# Patient Record
Sex: Female | Born: 1955
Health system: Southern US, Community
[De-identification: ages and names within clinical notes are randomized; demographics above are authoritative.]

## PROBLEM LIST (undated history)

## (undated) DIAGNOSIS — I10 Essential (primary) hypertension: Secondary | ICD-10-CM

## (undated) DIAGNOSIS — E079 Disorder of thyroid, unspecified: Secondary | ICD-10-CM

## (undated) DIAGNOSIS — E119 Type 2 diabetes mellitus without complications: Secondary | ICD-10-CM

## (undated) DIAGNOSIS — N289 Disorder of kidney and ureter, unspecified: Secondary | ICD-10-CM

## (undated) DIAGNOSIS — J449 Chronic obstructive pulmonary disease, unspecified: Secondary | ICD-10-CM

## (undated) HISTORY — PX: TUBAL LIGATION: SHX77

## (undated) HISTORY — PX: BACK SURGERY: SHX140

---

## 2000-09-12 ENCOUNTER — Encounter: Admission: RE | Admit: 2000-09-12 | Discharge: 2000-12-11 | Payer: Self-pay | Admitting: Internal Medicine

## 2000-10-18 ENCOUNTER — Encounter: Payer: Self-pay | Admitting: Internal Medicine

## 2000-10-18 ENCOUNTER — Encounter: Admission: RE | Admit: 2000-10-18 | Discharge: 2000-10-18 | Payer: Self-pay | Admitting: Internal Medicine

## 2010-02-21 ENCOUNTER — Emergency Department (HOSPITAL_BASED_OUTPATIENT_CLINIC_OR_DEPARTMENT_OTHER): Admission: EM | Admit: 2010-02-21 | Discharge: 2010-02-21 | Payer: Self-pay | Admitting: Emergency Medicine

## 2010-02-21 ENCOUNTER — Ambulatory Visit: Payer: Self-pay | Admitting: Diagnostic Radiology

## 2010-05-24 ENCOUNTER — Emergency Department (HOSPITAL_BASED_OUTPATIENT_CLINIC_OR_DEPARTMENT_OTHER): Admission: EM | Admit: 2010-05-24 | Discharge: 2010-05-24 | Payer: Self-pay | Admitting: Emergency Medicine

## 2010-06-28 ENCOUNTER — Emergency Department (HOSPITAL_BASED_OUTPATIENT_CLINIC_OR_DEPARTMENT_OTHER): Admission: EM | Admit: 2010-06-28 | Discharge: 2010-06-28 | Payer: Self-pay | Admitting: Emergency Medicine

## 2010-08-03 ENCOUNTER — Ambulatory Visit (HOSPITAL_COMMUNITY): Admission: RE | Admit: 2010-08-03 | Discharge: 2010-08-04 | Payer: Self-pay | Admitting: Specialist

## 2010-08-11 ENCOUNTER — Inpatient Hospital Stay (HOSPITAL_COMMUNITY): Admission: AD | Admit: 2010-08-11 | Discharge: 2010-08-15 | Payer: Self-pay | Admitting: Specialist

## 2010-09-21 ENCOUNTER — Inpatient Hospital Stay (HOSPITAL_COMMUNITY): Admission: RE | Admit: 2010-09-21 | Discharge: 2010-09-25 | Payer: Self-pay | Admitting: Specialist

## 2010-09-29 ENCOUNTER — Ambulatory Visit: Payer: Self-pay | Admitting: Psychiatry

## 2010-10-05 ENCOUNTER — Other Ambulatory Visit: Payer: Self-pay | Admitting: Emergency Medicine

## 2010-10-05 ENCOUNTER — Inpatient Hospital Stay (HOSPITAL_COMMUNITY)
Admission: RE | Admit: 2010-10-05 | Discharge: 2010-10-07 | Payer: Self-pay | Source: Home / Self Care | Admitting: Psychiatry

## 2010-10-27 ENCOUNTER — Encounter: Admit: 2010-10-27 | Payer: Self-pay | Admitting: Specialist

## 2011-01-26 LAB — BASIC METABOLIC PANEL
BUN: 5 mg/dL — ABNORMAL LOW (ref 6–23)
BUN: 7 mg/dL (ref 6–23)
CO2: 27 mEq/L (ref 19–32)
Chloride: 103 mEq/L (ref 96–112)
Creatinine, Ser: 0.69 mg/dL (ref 0.4–1.2)
GFR calc Af Amer: >60 mL/min (ref >60)
Glucose, Bld: 113 mg/dL — ABNORMAL HIGH (ref 70–99)
Potassium: 3.9 mEq/L (ref 3.5–5.1)
Potassium: 4.2 mEq/L (ref 3.5–5.1)
Sodium: 135 mEq/L (ref 135–145)
Sodium: 139 mEq/L (ref 135–145)

## 2011-01-26 LAB — COMPREHENSIVE METABOLIC PANEL
ALT: 15 U/L (ref 0–35)
Alkaline Phosphatase: 60 U/L (ref 39–117)
BUN: 4 mg/dL — ABNORMAL LOW (ref 6–23)
CO2: 29 mEq/L (ref 19–32)
Chloride: 100 mEq/L (ref 96–112)
Creatinine, Ser: 0.72 mg/dL (ref 0.4–1.2)
Sodium: 136 mEq/L (ref 135–145)
Total Bilirubin: 0.5 mg/dL (ref 0.3–1.2)
Total Protein: 6.7 g/dL (ref 6.0–8.3)

## 2011-01-26 LAB — URINALYSIS, ROUTINE W REFLEX MICROSCOPIC
Bilirubin Urine: NEGATIVE
Hgb urine dipstick: NEGATIVE
Urobilinogen, UA: 0.2 mg/dL (ref 0.0–1.0)
pH: 6 (ref 5.0–8.0)

## 2011-01-26 LAB — DIFFERENTIAL
Basophils Relative: 0 % (ref 0–1)
Eosinophils Absolute: 0.1 10*3/uL (ref 0.0–0.7)
Eosinophils Relative: 1 % (ref 0–5)
Lymphocytes Relative: 22 % (ref 12–46)
Lymphs Abs: 2 10*3/uL (ref 0.7–4.0)
Monocytes Absolute: 0.5 10*3/uL (ref 0.1–1.0)
Monocytes Relative: 4 % (ref 3–12)
Neutro Abs: 6.4 10*3/uL (ref 1.7–7.7)
Neutro Abs: 8.5 10*3/uL — ABNORMAL HIGH (ref 1.7–7.7)

## 2011-01-26 LAB — CBC
HCT: 31.9 % — ABNORMAL LOW (ref 36.0–46.0)
Hemoglobin: 10.2 g/dL — ABNORMAL LOW (ref 12.0–15.0)
MCH: 31.1 pg (ref 26.0–34.0)
MCH: 31.4 pg (ref 26.0–34.0)
MCHC: 33 g/dL (ref 30.0–36.0)
MCHC: 33.3 g/dL (ref 30.0–36.0)
MCHC: 33.7 g/dL (ref 30.0–36.0)
MCV: 93.1 fL (ref 78.0–100.0)
Platelets: 418 10*3/uL — ABNORMAL HIGH (ref 150–400)
RBC: 3.43 MIL/uL — ABNORMAL LOW (ref 3.87–5.11)
RDW: 13.2 % (ref 11.5–15.5)
RDW: 15.1 % (ref 11.5–15.5)
WBC: 8.9 10*3/uL (ref 4.0–10.5)
WBC: 9.1 10*3/uL (ref 4.0–10.5)

## 2011-01-26 LAB — RAPID URINE DRUG SCREEN, HOSP PERFORMED
Amphetamines: POSITIVE — AB
Benzodiazepines: NOT DETECTED

## 2011-01-26 LAB — SURGICAL PCR SCREEN: MRSA, PCR: NEGATIVE

## 2011-01-26 LAB — TRICYCLICS SCREEN, URINE: TCA Scrn: NOT DETECTED

## 2011-01-26 LAB — TYPE AND SCREEN: ABO/RH(D): A NEG

## 2011-01-28 LAB — CBC
Hemoglobin: 15.5 g/dL — ABNORMAL HIGH (ref 12.0–15.0)
Platelets: 223 10*3/uL (ref 150–400)
Platelets: 306 10*3/uL (ref 150–400)
RBC: 4.21 MIL/uL (ref 3.87–5.11)
RBC: 4.68 MIL/uL (ref 3.87–5.11)
RDW: 13.1 % (ref 11.5–15.5)

## 2011-01-28 LAB — URINALYSIS, MICROSCOPIC ONLY
Bilirubin Urine: NEGATIVE
Hgb urine dipstick: NEGATIVE
Ketones, ur: NEGATIVE mg/dL
Nitrite: NEGATIVE
pH: 7 (ref 5.0–8.0)

## 2011-01-28 LAB — COMPREHENSIVE METABOLIC PANEL
AST: 13 U/L (ref 0–37)
Albumin: 4.9 g/dL (ref 3.5–5.2)
Alkaline Phosphatase: 59 U/L (ref 39–117)
BUN: 7 mg/dL (ref 6–23)
BUN: 8 mg/dL (ref 6–23)
CO2: 29 mEq/L (ref 19–32)
CO2: 30 mEq/L (ref 19–32)
Chloride: 95 mEq/L — ABNORMAL LOW (ref 96–112)
Creatinine, Ser: 0.66 mg/dL (ref 0.4–1.2)
Creatinine, Ser: 0.7 mg/dL (ref 0.4–1.2)
GFR calc Af Amer: >60 mL/min (ref >60)
GFR calc non Af Amer: >60 mL/min (ref >60)
GFR calc non Af Amer: >60 mL/min (ref >60)
Glucose, Bld: 98 mg/dL (ref 70–99)
Potassium: 3.9 mEq/L (ref 3.5–5.1)
Total Bilirubin: 0.4 mg/dL (ref 0.3–1.2)

## 2011-01-28 LAB — DIFFERENTIAL
Basophils Relative: 0 % (ref 0–1)
Eosinophils Absolute: 0.1 10*3/uL (ref 0.0–0.7)
Eosinophils Relative: 1 % (ref 0–5)
Lymphocytes Relative: 23 % (ref 12–46)
Lymphs Abs: 2.3 10*3/uL (ref 0.7–4.0)
Monocytes Absolute: 0.5 10*3/uL (ref 0.1–1.0)
Monocytes Relative: 5 % (ref 3–12)

## 2011-01-28 LAB — SURGICAL PCR SCREEN
MRSA, PCR: NEGATIVE
Staphylococcus aureus: NEGATIVE

## 2011-01-28 LAB — PROTIME-INR: INR: 0.95 (ref 0.00–1.49)

## 2011-01-28 LAB — APTT: aPTT: 30 seconds (ref 24–37)

## 2011-01-29 LAB — DIFFERENTIAL
Basophils Relative: 1 % (ref 0–1)
Eosinophils Absolute: 0.4 10*3/uL (ref 0.0–0.7)
Eosinophils Relative: 5 % (ref 0–5)
Neutrophils Relative %: 66 % (ref 43–77)

## 2011-01-29 LAB — COMPREHENSIVE METABOLIC PANEL
ALT: 13 U/L (ref 0–35)
AST: 22 U/L (ref 0–37)
Alkaline Phosphatase: 73 U/L (ref 39–117)
CO2: 28 mEq/L (ref 19–32)
Chloride: 103 mEq/L (ref 96–112)
GFR calc Af Amer: >60 mL/min (ref >60)
GFR calc non Af Amer: >60 mL/min (ref >60)
Glucose, Bld: 111 mg/dL — ABNORMAL HIGH (ref 70–99)
Potassium: 3.7 mEq/L (ref 3.5–5.1)
Sodium: 140 mEq/L (ref 135–145)

## 2011-01-29 LAB — URINALYSIS, ROUTINE W REFLEX MICROSCOPIC
Glucose, UA: NEGATIVE mg/dL
Ketones, ur: NEGATIVE mg/dL
Specific Gravity, Urine: 1.013 (ref 1.005–1.030)
pH: 6.5 (ref 5.0–8.0)

## 2011-01-29 LAB — CBC
HCT: 41 % (ref 36.0–46.0)
Hemoglobin: 13.9 g/dL (ref 12.0–15.0)
RBC: 4.3 MIL/uL (ref 3.87–5.11)
WBC: 8.8 10*3/uL (ref 4.0–10.5)

## 2011-01-29 LAB — LIPASE, BLOOD: Lipase: 56 U/L (ref 23–300)

## 2011-02-03 LAB — URINE CULTURE
Colony Count: NO GROWTH
Culture: NO GROWTH

## 2011-02-03 LAB — URINALYSIS, ROUTINE W REFLEX MICROSCOPIC
Bilirubin Urine: NEGATIVE
Glucose, UA: NEGATIVE mg/dL
Ketones, ur: NEGATIVE mg/dL
Nitrite: NEGATIVE
Protein, ur: NEGATIVE mg/dL
pH: 6 (ref 5.0–8.0)

## 2011-02-03 LAB — GLUCOSE, CAPILLARY: Glucose-Capillary: 113 mg/dL — ABNORMAL HIGH (ref 70–99)

## 2011-06-02 ENCOUNTER — Ambulatory Visit
Admission: RE | Admit: 2011-06-02 | Discharge: 2011-06-02 | Disposition: A | Discharge status: Home / Self Care | Payer: Medicaid Other | Source: Ambulatory Visit | Attending: Specialist | Admitting: Specialist

## 2011-06-02 ENCOUNTER — Other Ambulatory Visit: Payer: Self-pay | Admitting: Specialist

## 2011-06-02 DIAGNOSIS — M549 Dorsalgia, unspecified: Secondary | ICD-10-CM

## 2011-06-07 IMAGING — CT CT L SPINE W/O CM
2 of 13 series · 3 of 20 positions shown, 4 images · IV contrast (omnipaque)
Comparison: Preoperative MRI from [REDACTED] [facility] orthopedic
specialist dated 03/03/2010.

MYELOGRAM INJECTION

CLINICAL DATA: 54-year-old female status post partial discectomy
at L4-L5 and hemilaminectomy on 08/03/2010 with subsequent severe
headache and recurrent right buttock pain radiating down the right
lower extremity.  Question CSF leak.
TECHNIQUE: Informed consent was obtained from the patient prior to
the procedure, including potential complications of headache,
allergy, infection and pain.  A timeout procedure was performed.
With the patient prone, the lower back was prepped with Betadine.
1% Lidocaine was used for local anesthesia.  Lumbar puncture was
performed at the L2-L3 level using left sublaminar technique and a
3.5 inches x 22 gauge needle with return of clear, colorless CSF.
16 ml of Omnipaque 899was injected into the subarachnoid space .
TECHNIQUE: Following injection of intrathecal Omnipaque contrast,
spine imaging in multiple projections was performed using
fluoroscopy.

Fluoroscopy Time: 0.5 minutes.
TECHNIQUE: CT imaging of the lumbar spine was performed after
intrathecal contrast administration.  Multiplanar CT image
reconstructions were also generated.

[Series 102: l-spine · axial · 0.43mm/px · z∈[-276,-193]mm · 2 of 705 slices shown, 3 images]
[im 235/705  soft-tissue]
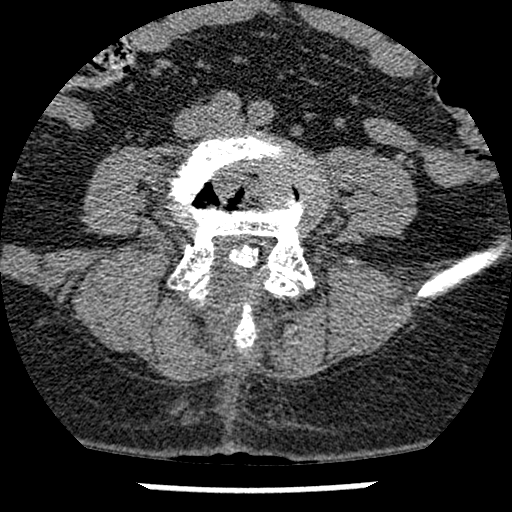
[im 235/705  bone]
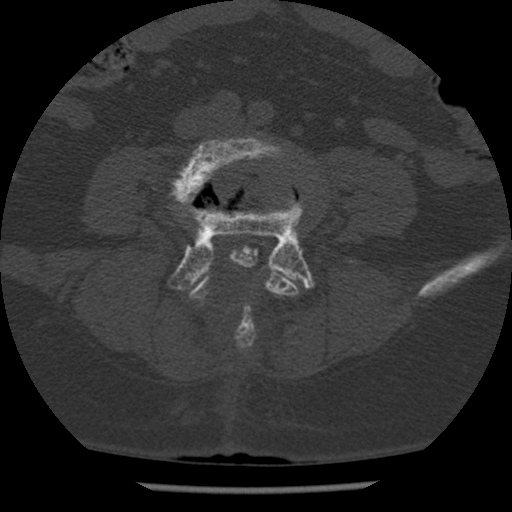
[im 470/705  bone]
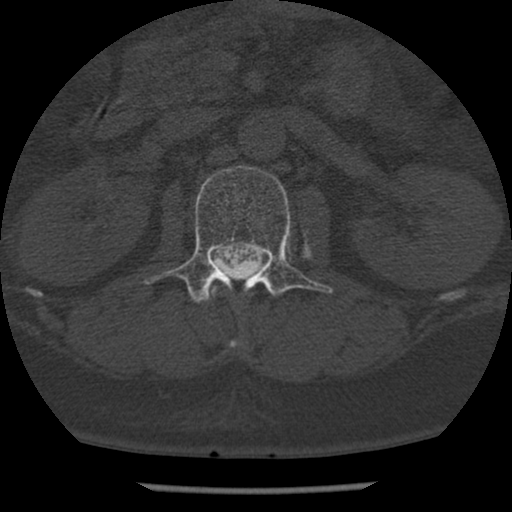

[Series 104: coronals detail · coronal · 0.50mm/px · 1 of 58 slices shown]
[im 29/58  bone]
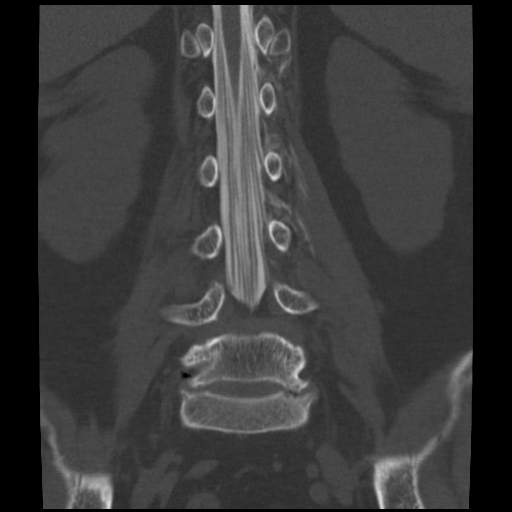

[3 of 20 positions shown; findings below may reference images not displayed]

IMPRESSION: Successful injection of  intrathecal contrast for myelography. See
below.

MYELOGRAM LUMBAR
FINDINGS: Good intrathecal contrast opacification.  Circumferential
narrowing of the thecal sac at L4-L5.  No extravasation of
intrathecal contrast was evident during fluoroscopic and spot film
evaluation.
IMPRESSION: 1.  Circumferential narrowing of the thecal sac at L4-L5.
2.  No extravasation of intrathecal contrast identified during the
fluoroscopic portion of the study. See post myelogram CT findings
below.

CT MYELOGRAPHY LUMBAR SPINE
FINDINGS: Faint excretion of IV contrast from the renal collecting
systems likely represents sequelae of venous intravasation as there
is a small component of mixed injection at the L2-L3 level on the
left. Epidural contrast is seen and tracks along the exiting left
L2 nerve root (series 2 images 44, 41), and tracks in the left deep
paraspinal musculature cephalad (series 2 image 15). Mild calcified
atherosclerosis of the abdominal aorta.  Other visualized abdominal
viscera are within normal limits.

Postoperative changes on the right at L4-L5 with sequelae of
hemilaminectomy.  Small volume of gas in the surgical bed and along
the right paraspinal muscles and at the right L4 distal neural
foramen.  Small volume of low to intermediate density material
occupying the epidural space, laminectomy space, and right lateral
recess at L4-L5 probably represents postoperative hematoma or
seroma.  Moderate spinal stenosis at L4-L5 appears not
significantly changed from the preoperative MRI.  Compression of
the thecal sac appears mildly increased at the mid L5 level (series
2 image 72).  No extravasation of intrathecal contrast in or around
the operative site.

Grade 1 spondylolisthesis and bilateral chronic spondylolysis at L4-
L5 is re-identified superimposed on the right laminectomy.  Stable
vertebral body height and alignment.  The T12-L1 through L3-L4
levels are stable compared the preoperative study.  Normal conus
medullaris at L1.  Visualized sacrum and SI joints are within
normal limits.
IMPRESSION: 1.  No CSF leak demonstrated at or around the operative site.
Postoperative changes including a small volume of hematoma/seroma
in the L4-L5 epidural, right lateral recess, and right
hemilaminectomy spaces.    Subsequent spinal stenosis appears
stable to mildly progressed compared to the preoperative MRI.
2.  Small volume of epidural and paraspinal contrast at the L2-L3
level on the left related to mixed injection/iatrogenic.
3. Stable chronic L4-L5 spondylolisthesis and bilateral
spondylolysis.
4.  T12-L1 through L3-L4 levels are otherwise stable.

## 2014-02-06 ENCOUNTER — Ambulatory Visit: Payer: Self-pay | Admitting: Podiatrist

## 2015-02-28 ENCOUNTER — Encounter (HOSPITAL_BASED_OUTPATIENT_CLINIC_OR_DEPARTMENT_OTHER): Payer: Self-pay

## 2015-02-28 ENCOUNTER — Emergency Department (HOSPITAL_BASED_OUTPATIENT_CLINIC_OR_DEPARTMENT_OTHER): Payer: Medicaid Other

## 2015-02-28 ENCOUNTER — Emergency Department (HOSPITAL_BASED_OUTPATIENT_CLINIC_OR_DEPARTMENT_OTHER)
Admission: EM | Admit: 2015-02-28 | Discharge: 2015-02-28 | Disposition: A | Discharge status: Home / Self Care | Payer: Medicaid Other | Attending: Emergency Medicine | Admitting: Emergency Medicine

## 2015-02-28 DIAGNOSIS — Y9389 Activity, other specified: Secondary | ICD-10-CM | POA: Insufficient documentation

## 2015-02-28 DIAGNOSIS — S66911A Strain of unspecified muscle, fascia and tendon at wrist and hand level, right hand, initial encounter: Secondary | ICD-10-CM | POA: Insufficient documentation

## 2015-02-28 DIAGNOSIS — Z87448 Personal history of other diseases of urinary system: Secondary | ICD-10-CM | POA: Insufficient documentation

## 2015-02-28 DIAGNOSIS — I10 Essential (primary) hypertension: Secondary | ICD-10-CM | POA: Insufficient documentation

## 2015-02-28 DIAGNOSIS — X58XXXA Exposure to other specified factors, initial encounter: Secondary | ICD-10-CM | POA: Insufficient documentation

## 2015-02-28 DIAGNOSIS — S6991XA Unspecified injury of right wrist, hand and finger(s), initial encounter: Secondary | ICD-10-CM | POA: Diagnosis present

## 2015-02-28 DIAGNOSIS — Z720 Tobacco use: Secondary | ICD-10-CM | POA: Insufficient documentation

## 2015-02-28 DIAGNOSIS — Y990 Civilian activity done for income or pay: Secondary | ICD-10-CM | POA: Diagnosis not present

## 2015-02-28 DIAGNOSIS — E119 Type 2 diabetes mellitus without complications: Secondary | ICD-10-CM | POA: Insufficient documentation

## 2015-02-28 DIAGNOSIS — Y9289 Other specified places as the place of occurrence of the external cause: Secondary | ICD-10-CM | POA: Insufficient documentation

## 2015-02-28 DIAGNOSIS — M25531 Pain in right wrist: Secondary | ICD-10-CM

## 2015-02-28 DIAGNOSIS — J449 Chronic obstructive pulmonary disease, unspecified: Secondary | ICD-10-CM | POA: Insufficient documentation

## 2015-02-28 DIAGNOSIS — M25539 Pain in unspecified wrist: Secondary | ICD-10-CM

## 2015-02-28 HISTORY — DX: Essential (primary) hypertension: I10

## 2015-02-28 HISTORY — DX: Chronic obstructive pulmonary disease, unspecified: J44.9

## 2015-02-28 HISTORY — DX: Type 2 diabetes mellitus without complications: E11.9

## 2015-02-28 HISTORY — DX: Disorder of kidney and ureter, unspecified: N28.9

## 2015-02-28 HISTORY — DX: Disorder of thyroid, unspecified: E07.9

## 2015-02-28 MED ORDER — HYDROCODONE-ACETAMINOPHEN 5-325 MG PO TABS
1.0000 | ORAL_TABLET | Freq: Once | ORAL | Status: AC
  Administered 2015-02-28: 1 via ORAL
  Filled 2015-02-28: qty 1

## 2015-02-28 MED ORDER — OXYCODONE-ACETAMINOPHEN 5-325 MG PO TABS
1.0000 | ORAL_TABLET | Freq: Once | ORAL | Status: AC
  Administered 2015-02-28: 1 via ORAL
  Filled 2015-02-28: qty 1

## 2015-02-28 MED ORDER — HYDROCODONE-ACETAMINOPHEN 5-325 MG PO TABS: 1.0000 | ORAL_TABLET | Freq: Four times a day (QID) | ORAL | Status: AC | PRN

## 2015-02-28 NOTE — ED Notes (Signed)
Pt reports she was mulching her yard today when she developed right wrist pain - denies obvious injury - painful ROM.

## 2015-02-28 NOTE — ED Provider Notes (Signed)
CSN: [id number]     Arrival date & time 02/28/15  1645 History   First MD Initiated Contact with Patient 02/28/15 1731     Chief Complaint  Patient presents with  . Wrist Pain     (Consider location/radiation/quality/duration/timing/severity/associated sxs/prior Treatment) HPI  Patient presents with right wrist pain. Patient reports that she was doing a lot of yard work today and heavy lifting when she developed pain in the right wrist. She denies any direct injury or notable deformity. Current pain is 9 out of 10. Nothing makes it better or worse. It is painful with range of motion. Patient states "I just can't take the pain anymore."  Past Medical History  Diagnosis Date  . Hypertension   . Diabetes mellitus without complication   . Renal disorder   . COPD (chronic obstructive pulmonary disease)   . Thyroid disease    Past Surgical History  Procedure Laterality Date  . Back surgery     History reviewed. No pertinent family history. History  Substance Use Topics  . Smoking status: Current Every Day Smoker  . Smokeless tobacco: Not on file  . Alcohol Use: No   OB History    No data available     Review of Systems  Musculoskeletal:       Right wrist pain  Skin: Negative for color change and wound.  All other systems reviewed and are negative.     Allergies  Review of patient's allergies indicates no known allergies.  Home Medications   Prior to Admission medications   Medication Sig Start Date End Date Taking? Authorizing Provider  HYDROcodone-acetaminophen (NORCO/VICODIN) 5-325 MG per tablet Take 1 tablet by mouth every 6 (six) hours as needed for moderate pain. 02/28/15   Shon Batonourtney F Horton, MD   BP 159/93 mmHg  Pulse 111  Temp(Src) 99.1 F (37.3 C) (Oral)  Resp 16  Ht 5\' 6"  (1.676 m)  Wt 180 lb (81.647 kg)  BMI 29.07 kg/m2  SpO2 95% Physical Exam  Constitutional: She is oriented to person, place, and time. She appears well-developed and well-nourished.   HENT:  Head: Normocephalic and atraumatic.  Cardiovascular: Normal rate, regular rhythm and normal heart sounds.   Pulmonary/Chest: Effort normal. No respiratory distress.  Musculoskeletal:  Focused examination of the right wrist; full range of motion of the wrist, does induce pain per the patient, no obvious deformity, 2+ radial pulse, pain with finkelstein test, no snuffbox tenderness, no overlying skin changes  Neurological: She is alert and oriented to person, place, and time.  Skin: Skin is warm and dry.  Psychiatric: She has a normal mood and affect.  Nursing note and vitals reviewed.   ED Course  Procedures (including critical care time) Labs Review Labs Reviewed - No data to display  Imaging Review Dg Wrist 2 Views Right  02/28/2015   CLINICAL DATA:  Medial and lateral pain after yd work. Symptoms today.  EXAM: RIGHT WRIST - 2 VIEW  COMPARISON:  None.  FINDINGS: There is no evidence of fracture or dislocation. There is no evidence of arthropathy or other focal bone abnormality. Soft tissues are unremarkable.  IMPRESSION: Normal   Electronically Signed   By: Paulina FusiMark  Shogry M.D.   On: 02/28/2015 17:19     EKG Interpretation None      MDM   Final diagnoses:  Wrist strain, right, initial encounter    Patient presents with right wrist pain following work earlier today. Suspect overuse injury versus strain versus de Quervain's tenosynovitis.  Plain films are negative. Patient given pain medication. Will place patient in a splint and educate regarding supportive care at home.  After history, exam, and medical workup I feel the patient has been appropriately medically screened and is safe for discharge home. Pertinent diagnoses were discussed with the patient. Patient was given return precautions.     Shon Baton, MD 03/01/15 [phone]

## 2015-02-28 NOTE — Discharge Instructions (Signed)

## 2016-08-02 DIAGNOSIS — R200 Anesthesia of skin: Secondary | ICD-10-CM | POA: Insufficient documentation

## 2018-05-17 ENCOUNTER — Other Ambulatory Visit: Payer: Self-pay | Admitting: Orthopedic Surgery

## 2018-05-17 DIAGNOSIS — M961 Postlaminectomy syndrome, not elsewhere classified: Secondary | ICD-10-CM

## 2018-10-18 ENCOUNTER — Emergency Department (HOSPITAL_BASED_OUTPATIENT_CLINIC_OR_DEPARTMENT_OTHER): Payer: BLUE CROSS/BLUE SHIELD

## 2018-10-18 ENCOUNTER — Other Ambulatory Visit: Payer: Self-pay

## 2018-10-18 ENCOUNTER — Emergency Department (HOSPITAL_BASED_OUTPATIENT_CLINIC_OR_DEPARTMENT_OTHER)
Admission: EM | Admit: 2018-10-18 | Discharge: 2018-10-18 | Disposition: A | Discharge status: Home / Self Care | Payer: BLUE CROSS/BLUE SHIELD | Attending: Emergency Medicine | Admitting: Emergency Medicine

## 2018-10-18 ENCOUNTER — Encounter (HOSPITAL_BASED_OUTPATIENT_CLINIC_OR_DEPARTMENT_OTHER): Payer: Self-pay

## 2018-10-18 DIAGNOSIS — J449 Chronic obstructive pulmonary disease, unspecified: Secondary | ICD-10-CM | POA: Diagnosis not present

## 2018-10-18 DIAGNOSIS — Y9389 Activity, other specified: Secondary | ICD-10-CM | POA: Insufficient documentation

## 2018-10-18 DIAGNOSIS — K5900 Constipation, unspecified: Secondary | ICD-10-CM | POA: Diagnosis not present

## 2018-10-18 DIAGNOSIS — Y999 Unspecified external cause status: Secondary | ICD-10-CM | POA: Diagnosis not present

## 2018-10-18 DIAGNOSIS — I10 Essential (primary) hypertension: Secondary | ICD-10-CM | POA: Diagnosis not present

## 2018-10-18 DIAGNOSIS — F17210 Nicotine dependence, cigarettes, uncomplicated: Secondary | ICD-10-CM | POA: Diagnosis not present

## 2018-10-18 DIAGNOSIS — R1901 Right upper quadrant abdominal swelling, mass and lump: Secondary | ICD-10-CM | POA: Insufficient documentation

## 2018-10-18 DIAGNOSIS — E1165 Type 2 diabetes mellitus with hyperglycemia: Secondary | ICD-10-CM | POA: Insufficient documentation

## 2018-10-18 DIAGNOSIS — Y929 Unspecified place or not applicable: Secondary | ICD-10-CM | POA: Diagnosis not present

## 2018-10-18 DIAGNOSIS — S299XXA Unspecified injury of thorax, initial encounter: Secondary | ICD-10-CM | POA: Diagnosis present

## 2018-10-18 DIAGNOSIS — S22080A Wedge compression fracture of T11-T12 vertebra, initial encounter for closed fracture: Secondary | ICD-10-CM

## 2018-10-18 DIAGNOSIS — X58XXXA Exposure to other specified factors, initial encounter: Secondary | ICD-10-CM | POA: Insufficient documentation

## 2018-10-18 DIAGNOSIS — R739 Hyperglycemia, unspecified: Secondary | ICD-10-CM

## 2018-10-18 LAB — URINALYSIS, ROUTINE W REFLEX MICROSCOPIC
Bilirubin Urine: NEGATIVE
Glucose, UA: NEGATIVE mg/dL
HGB URINE DIPSTICK: NEGATIVE
Ketones, ur: NEGATIVE mg/dL
Leukocytes, UA: NEGATIVE
Nitrite: NEGATIVE
Protein, ur: NEGATIVE mg/dL
Specific Gravity, Urine: 1.01 (ref 1.005–1.030)
pH: 8.5 — ABNORMAL HIGH (ref 5.0–8.0)

## 2018-10-18 LAB — COMPREHENSIVE METABOLIC PANEL
ALT: 21 U/L (ref 0–44)
AST: 16 U/L (ref 15–41)
Albumin: 3.6 g/dL (ref 3.5–5.0)
Alkaline Phosphatase: 122 U/L (ref 38–126)
Anion gap: 9 (ref 5–15)
BUN: 8 mg/dL (ref 8–23)
CO2: 26 mmol/L (ref 22–32)
CREATININE: 0.74 mg/dL (ref 0.44–1.00)
Calcium: 8.9 mg/dL (ref 8.9–10.3)
Chloride: 99 mmol/L (ref 98–111)
GFR calc non Af Amer: >60 mL/min (ref >60)
Glucose, Bld: 213 mg/dL — ABNORMAL HIGH (ref 70–99)
Potassium: 3.6 mmol/L (ref 3.5–5.1)
Sodium: 134 mmol/L — ABNORMAL LOW (ref 135–145)
Total Bilirubin: 0.3 mg/dL (ref 0.3–1.2)
Total Protein: 6.8 g/dL (ref 6.5–8.1)

## 2018-10-18 LAB — CBC WITH DIFFERENTIAL/PLATELET
ABS IMMATURE GRANULOCYTES: 0.06 10*3/uL (ref 0.00–0.07)
BASOS ABS: 0 10*3/uL (ref 0.0–0.1)
BASOS PCT: 0 %
Eosinophils Absolute: 0.1 10*3/uL (ref 0.0–0.5)
Eosinophils Relative: 1 %
HCT: 44.8 % (ref 36.0–46.0)
Hemoglobin: 14.4 g/dL (ref 12.0–15.0)
IMMATURE GRANULOCYTES: 1 %
Lymphocytes Relative: 26 %
Lymphs Abs: 2.5 10*3/uL (ref 0.7–4.0)
MCH: 29.3 pg (ref 26.0–34.0)
MCHC: 32.1 g/dL (ref 30.0–36.0)
MCV: 91.1 fL (ref 80.0–100.0)
Monocytes Absolute: 0.7 10*3/uL (ref 0.1–1.0)
Monocytes Relative: 7 %
NEUTROS ABS: 6.5 10*3/uL (ref 1.7–7.7)
NEUTROS PCT: 65 %
NRBC: 0 % (ref 0.0–0.2)
PLATELETS: 198 10*3/uL (ref 150–400)
RBC: 4.92 MIL/uL (ref 3.87–5.11)
RDW: 13.3 % (ref 11.5–15.5)
WBC: 9.9 10*3/uL (ref 4.0–10.5)

## 2018-10-18 LAB — LIPASE, BLOOD: Lipase: 25 U/L (ref 11–51)

## 2018-10-18 MED ORDER — POLYETHYLENE GLYCOL 3350 17 GM/SCOOP PO POWD: 1.0000 | Freq: Once | ORAL | 0 refills | Status: AC

## 2018-10-18 MED ORDER — CELECOXIB 200 MG PO CAPS: 200.0000 mg | ORAL_CAPSULE | Freq: Two times a day (BID) | ORAL | 0 refills | Status: AC

## 2018-10-18 MED ORDER — IOPAMIDOL (ISOVUE-300) INJECTION 61%
100.0000 mL | Freq: Once | INTRAVENOUS | Status: AC | PRN
  Administered 2018-10-18: 100 mL via INTRAVENOUS

## 2018-10-18 MED FILL — CELECOXIB 200 MG CAP: 200 | 15 days supply | Qty: 30 | Fill #0

## 2018-10-18 MED FILL — SM CLEARLAX POWDER: 1 days supply | Qty: 238 | Fill #0

## 2018-10-18 NOTE — ED Triage Notes (Addendum)
Pt c/o mass to right side of abd and "kidney pain" x 3 weeks-multiple visits to PCP-was told MRI was denied until US-US has not been scheduled-NAD-steady gait

## 2018-10-18 NOTE — Discharge Instructions (Signed)
You will need to get your blood sugar rechecked by your doctor.

## 2018-10-18 NOTE — ED Provider Notes (Signed)
[facility] EMERGENCY DEPARTMENT Provider Note   CSN: [id number] Arrival date & time: 10/18/18  1157     History   Chief Complaint Chief Complaint  Patient presents with  . Mass    HPI Bridget Johnson is a 62 y.o. female.  Pt presents to the ED today with a mass to her RUQ.  The pt said she's had the mass for about 3 weeks.  She noticed it after a bought of pneumonia.  She said she's seen her doctor, but no tests have been done.  The pt denies f/c.       Past Medical History:  Diagnosis Date  . COPD (chronic obstructive pulmonary disease) (HCC)   . Diabetes mellitus without complication (HCC)   . Hypertension   . Renal disorder   . Thyroid disease     There are no active problems to display for this patient.   Past Surgical History:  Procedure Laterality Date  . BACK SURGERY    . TUBAL LIGATION       OB History   None      Home Medications    Prior to Admission medications   Medication Sig Start Date End Date Taking? Authorizing Provider  celecoxib (CELEBREX) 200 MG capsule Take 1 capsule (200 mg total) by mouth 2 (two) times daily. 10/18/18   Jacalyn LefevreHaviland, Imanii Gosdin, MD  HYDROcodone-acetaminophen (NORCO/VICODIN) 5-325 MG per tablet Take 1 tablet by mouth every 6 (six) hours as needed for moderate pain. 02/28/15   Horton, Mayer Maskerourtney F, MD  polyethylene glycol powder (MIRALAX) powder Take 255 g by mouth once for 1 dose. 10/18/18 10/18/18  Jacalyn LefevreHaviland, Haylen Bellotti, MD    Family History No family history on file.  Social History Social History   Tobacco Use  . Smoking status: Current Every Day Smoker    Types: Cigarettes  . Smokeless tobacco: Never Used  Substance Use Topics  . Alcohol use: No  . Drug use: No     Allergies   Trazodone and nefazodone   Review of Systems Review of Systems  Gastrointestinal: Positive for abdominal pain.  All other systems reviewed and are negative.    Physical Exam Updated Vital Signs BP (!) 134/97 (BP Location: Left  Arm)   Pulse 84   Temp 98.5 F (36.9 C) (Oral)   Resp 18   Ht 5\' 6"  (1.676 m)   Wt 81.6 kg   SpO2 95%   BMI 29.05 kg/m   Physical Exam  Constitutional: She is oriented to person, place, and time. She appears well-developed and well-nourished.  HENT:  Head: Normocephalic and atraumatic.  Right Ear: External ear normal.  Left Ear: External ear normal.  Nose: Nose normal.  Mouth/Throat: Oropharynx is clear and moist.  Eyes: Pupils are equal, round, and reactive to light. Conjunctivae and EOM are normal.  Neck: Normal range of motion. Neck supple.  Cardiovascular: Normal rate, regular rhythm, normal heart sounds and intact distal pulses.  Pulmonary/Chest: Effort normal and breath sounds normal.  Abdominal: Soft. Bowel sounds are normal. She exhibits mass.  Musculoskeletal: Normal range of motion.  Neurological: She is alert and oriented to person, place, and time.  Skin: Skin is warm. Capillary refill takes less than 2 seconds.  Psychiatric: She has a normal mood and affect. Her behavior is normal. Judgment and thought content normal.  Nursing note and vitals reviewed.    ED Treatments / Results  Labs (all labs ordered are listed, but only abnormal results are displayed) Labs Reviewed  COMPREHENSIVE METABOLIC PANEL - Abnormal; Notable for the following components:      Result Value   Sodium 134 (*)    Glucose, Bld 213 (*)    All other components within normal limits  URINALYSIS, ROUTINE W REFLEX MICROSCOPIC - Abnormal; Notable for the following components:   pH 8.5 (*)    All other components within normal limits  CBC WITH DIFFERENTIAL/PLATELET  LIPASE, BLOOD    EKG None  Radiology Dg Chest 2 View  Result Date: 10/18/2018 CLINICAL DATA:  Chest congestion x5 days EXAM: CHEST - 2 VIEW COMPARISON:  09/26/2010 FINDINGS: Normal heart size. Tortuous minimally atherosclerotic aorta without aneurysm. Mild interstitial edema with bibasilar platelike atelectasis and/or  scarring noted. No effusion, acute pulmonary consolidation or dominant mass is identified. Degenerative changes are present along the dorsal spine. Partially included lumbar spinal fusion hardware is noted. IMPRESSION: Mild interstitial edema. Bibasilar platelike atelectasis and/or scarring. Electronically Signed   By: Tollie Eth M.D.   On: 10/18/2018 13:49   Ct Abdomen Pelvis W Contrast  Result Date: 10/18/2018 CLINICAL DATA:  62 y/o F; patient complains of mass to the right-sided abdomen and kidney pain for 3 weeks. EXAM: CT ABDOMEN AND PELVIS WITH CONTRAST TECHNIQUE: Multidetector CT imaging of the abdomen and pelvis was performed using the standard protocol following bolus administration of intravenous contrast. CONTRAST:  ISOVUE-300 IOPAMIDOL (ISOVUE-300) INJECTION 61% COMPARISON:  09/27/2017 lumbar spine radiographs. FINDINGS: Lower chest: No acute abnormality. Hepatobiliary: Hepatic steatosis. No focal liver abnormality is seen. No gallstones, gallbladder wall thickening, or biliary dilatation. Pancreas: Unremarkable. No pancreatic ductal dilatation or surrounding inflammatory changes. Spleen: Normal in size without focal abnormality. Adrenals/Urinary Tract: Adrenal glands are unremarkable. Kidneys are normal, without renal calculi, focal lesion, or hydronephrosis. Bladder is unremarkable. Stomach/Bowel: Stomach is within normal limits. 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 Appendix appears normal. No evidence of bowel wall thickening, distention, or inflammatory changes. Vascular/Lymphatic: Aortic atherosclerosis. No enlarged abdominal or pelvic lymph nodes. Reproductive: Uterus and bilateral adnexa are unremarkable. Other: No abdominal wall hernia or abnormality. No abdominopelvic ascites. Musculoskeletal: L4-5 posterior instrumented fusion. L4-5 grade 1 anterolisthesis is mildly progressed. L4-5 nonunion as described on prior CTs of the lumbar spine. Severe T12  compression deformity with 5 mm retropulsion of the superior endplate. L1 inferior endplate fracture with mild anterior and central loss of vertebral body height. IMPRESSION: 1. No mass or inflammatory process identified. 2. Hepatic steatosis. 3. 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 4. L4-5 posterior instrumented fusion. 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 Progression of L4-5 grade 1 anterolisthesis. 5. Age indeterminate severe T12 compression deformity with 5 mm retropulsion of superior endplate. 6. Age-indeterminate L1 inferior endplate fracture with mild anterior and central loss of vertebral body height. Electronically Signed   By: Mitzi Hansen M.D.   On: 10/18/2018 14:04    Procedures Procedures (including critical care time)  Medications Ordered in ED Medications  iopamidol (ISOVUE-300) 61 % injection 100 mL (100 mLs Intravenous Contrast Given 10/18/18 1332)     Initial Impression / Assessment and Plan / ED Course  I have reviewed the triage vital signs and the nursing notes.  Pertinent labs & imaging results that were available during my care of the patient were reviewed by me and considered in my medical decision making (see chart for details).    Pt is feeling much better.  She does not have a mass on CT, so the mass we feel is likely a lipoma vs muscle tear  from coughing.  She does have a T12 compression fracture of which she is aware.  She has an orthopedist and will f/u with that doctor.  BS is high with no hx of DM, so she is told to get that rechecked by pcp.  She has also been constipated, so is put on miralax.  She knows to return if worse.  Final Clinical Impressions(s) / ED Diagnoses   Final diagnoses:  Compression fracture of T12 vertebra, initial encounter (HCC)  Hyperglycemia  Constipation, unspecified constipation type    ED Discharge Orders         Ordered    polyethylene glycol powder (MIRALAX) powder   Once     10/18/18 1453     celecoxib (CELEBREX) 200 MG capsule  2 times daily     10/18/18 1453           Jacalyn Lefevre, MD 10/18/18 1501

## 2019-05-08 DIAGNOSIS — E538 Deficiency of other specified B group vitamins: Secondary | ICD-10-CM | POA: Insufficient documentation

## 2019-05-08 DIAGNOSIS — R918 Other nonspecific abnormal finding of lung field: Secondary | ICD-10-CM | POA: Insufficient documentation

## 2019-05-08 DIAGNOSIS — B009 Herpesviral infection, unspecified: Secondary | ICD-10-CM | POA: Insufficient documentation

## 2019-05-08 DIAGNOSIS — E559 Vitamin D deficiency, unspecified: Secondary | ICD-10-CM | POA: Insufficient documentation

## 2019-05-08 DIAGNOSIS — M5416 Radiculopathy, lumbar region: Secondary | ICD-10-CM | POA: Insufficient documentation

## 2019-05-08 DIAGNOSIS — M818 Other osteoporosis without current pathological fracture: Secondary | ICD-10-CM | POA: Insufficient documentation

## 2019-05-08 DIAGNOSIS — E1142 Type 2 diabetes mellitus with diabetic polyneuropathy: Secondary | ICD-10-CM | POA: Insufficient documentation

## 2019-10-04 DIAGNOSIS — I10 Essential (primary) hypertension: Secondary | ICD-10-CM | POA: Insufficient documentation

## 2019-10-04 DIAGNOSIS — D649 Anemia, unspecified: Secondary | ICD-10-CM | POA: Insufficient documentation

## 2019-10-04 DIAGNOSIS — E119 Type 2 diabetes mellitus without complications: Secondary | ICD-10-CM | POA: Insufficient documentation

## 2021-03-03 DIAGNOSIS — H2510 Age-related nuclear cataract, unspecified eye: Secondary | ICD-10-CM | POA: Insufficient documentation

## 2023-05-09 ENCOUNTER — Ambulatory Visit: Payer: Medicare HMO | Admitting: Adult Health

## 2023-05-30 ENCOUNTER — Telehealth (INDEPENDENT_AMBULATORY_CARE_PROVIDER_SITE_OTHER): Payer: Medicare HMO | Admitting: Adult Health

## 2023-05-30 ENCOUNTER — Encounter: Payer: Self-pay | Admitting: Adult Health

## 2023-05-30 VITALS — Ht 63.0 in | Wt 162.0 lb

## 2023-05-30 DIAGNOSIS — F902 Attention-deficit hyperactivity disorder, combined type: Secondary | ICD-10-CM

## 2023-05-30 DIAGNOSIS — G4733 Obstructive sleep apnea (adult) (pediatric): Secondary | ICD-10-CM | POA: Insufficient documentation

## 2023-05-30 DIAGNOSIS — M109 Gout, unspecified: Secondary | ICD-10-CM | POA: Insufficient documentation

## 2023-05-30 DIAGNOSIS — G894 Chronic pain syndrome: Secondary | ICD-10-CM | POA: Insufficient documentation

## 2023-05-30 DIAGNOSIS — E785 Hyperlipidemia, unspecified: Secondary | ICD-10-CM | POA: Insufficient documentation

## 2023-05-30 DIAGNOSIS — G2581 Restless legs syndrome: Secondary | ICD-10-CM | POA: Insufficient documentation

## 2023-05-30 DIAGNOSIS — F909 Attention-deficit hyperactivity disorder, unspecified type: Secondary | ICD-10-CM | POA: Insufficient documentation

## 2023-05-30 DIAGNOSIS — J449 Chronic obstructive pulmonary disease, unspecified: Secondary | ICD-10-CM | POA: Insufficient documentation

## 2023-05-30 DIAGNOSIS — E119 Type 2 diabetes mellitus without complications: Secondary | ICD-10-CM | POA: Insufficient documentation

## 2023-05-30 MED ORDER — AMPHETAMINE-DEXTROAMPHETAMINE 5 MG PO TABS: 5.0000 mg | ORAL_TABLET | Freq: Every day | ORAL | 0 refills | Status: DC

## 2023-05-30 NOTE — Progress Notes (Signed)
[facility] MD/PA/NP Initial Note  05/30/2023 3:18 PM Bridget Johnson  MRN:  [id number]  Virtual Visit via Video Note  I connected with pt @ on 05/30/23 at  2:40 PM EDT by a video enabled telemedicine application and verified that I am speaking with the correct person using two identifiers.   I discussed the limitations of evaluation and management by telemedicine and the availability of in person appointments. The patient expressed understanding and agreed to proceed.  I discussed the assessment and treatment plan with the patient. The patient was provided an opportunity to ask questions and all were answered. The patient agreed with the plan and demonstrated an understanding of the instructions.   The patient was advised to call back or seek an in-person evaluation if the symptoms worsen or if the condition fails to improve as anticipated.  I provided 60 minutes of non-face-to-face time during this encounter.  The patient was located at home.  The provider was located at [facility].   Dorothyann Gibbs, NP   _____________________________________________________________________________________________  Initial evaluation  Chief Complaint:   HPI:   Patient seen today for initial psychiatric evaluation.   Referred by neurology for continuation of Adderall.   Describes mood today as "ok". Pleasant. Denies tearfulness. Mood symptoms - denies depression, anxiety, and irritability. Denies panic attacks. Denies worry, rumination, and over thinking. Mood is consistent. Stating "I feel like I'm doing alright". Has taken a stimulant since she was in her 10's and is currently on Adderall 5mg  three times a day. Feels like medication works well for her. Reports several health issues and is followed by [facility] - Andris Baumann. Her PCP has been prescribing Adderall until she could find a psychiatric provider. Stable interest and motivation. Taking medications as prescribed.  Energy  levels vary. Active, does not have a regular exercise routine - difficulties walking.   Enjoys some usual interests and activities. Widowed. Lives alone. Has 2 sons. Spending time with family. Appetite adequate. Weight stable. Sleeps is stable - diagnosed with sleep apnea 15 years ago. Does not have a CPAP machine. Averages 5 hours. Reports daytime napping - 1 to 2 hours. Focus and concentration stable. Diagnosed with ADD in her thirties. Reports being trialed on various medications - feels like the Adderall. Completing tasks. Managing aspects of household.  Denies SI or HI.  Denies AH or VH. Denies self harm. Denies substance use.  Previous medication trials:  Vyvanse,  Visit Diagnosis:    ICD-10-CM   1. Attention deficit hyperactivity disorder (ADHD), combined type  F90.2       Past Psychiatric History: Denies psychiatric hospitalization.   Past Medical History:  Past Medical History:  Diagnosis Date   COPD (chronic obstructive pulmonary disease) (HCC)    Diabetes mellitus without complication (HCC)    Hypertension    Renal disorder    Thyroid disease     Past Surgical History:  Procedure Laterality Date   BACK SURGERY     TUBAL LIGATION      Family Psychiatric History: Denies any family history of mental illness.   Family History: No family history on file.  Social History:  Social History   Socioeconomic History   Marital status: Widowed    Spouse name: Not on file   Number of children: Not on file   Years of education: Not on file   Highest education level: Not on file  Occupational History   Not on file  Tobacco Use   Smoking status: Every  Day    Types: Cigarettes   Smokeless tobacco: Never  Vaping Use   Vaping status: Never Used  Substance and Sexual Activity   Alcohol use: No   Drug use: No   Sexual activity: Not on file  Other Topics Concern   Not on file  Social History Narrative   Not on file   Social Determinants of Health   Financial  Resource Strain: Not on file  Food Insecurity: Low Risk  (04/24/2023)   Received from [facility]   Food vital sign    Within the past 12 months, you worried that your food would run out before you got money to buy more: Never true    Within the past 12 months, the food you bought just didn't last and you didn't have money to get more. : Never true  Transportation Needs: Not on file (04/17/2023)  Physical Activity: Not on file  Stress: Not on file  Social Connections: Not on file    Allergies:  Allergies  Allergen Reactions   Amitriptyline Other (See Comments)    confusion   Duloxetine Other (See Comments)    drowsiness  Other reaction(s): Lethargy (intolerance)  drowsiness   Cyclobenzaprine Other (See Comments)    Muscle jerks  Muscle jerks  Muscle jerks   Fluconazole Other (See Comments)    Muscle jerks  Muscle jerks  Muscle jerks   Linagliptin Other (See Comments)    Joint pain/muscle pain  Joint pain/muscle pain  Joint pain/muscle pain   Simvastatin Other (See Comments)    Muscle pain  Muscle pain  Muscle pain   Trazodone And Nefazodone Other (See Comments)    States she has many other allergies and does not know name  Muscle jerks  Muscle jerks  States she has many other allergies and does not know name  Muscle jerks   Vilazodone Other (See Comments)    Muscle jerks  Muscle jerks  Muscle jerks    Metabolic Disorder Labs: No results found for: "HGBA1C", "MPG" No results found for: "PROLACTIN" No results found for: "CHOL", "TRIG", "HDL", "CHOLHDL", "VLDL", "LDLCALC" No results found for: "TSH"  Therapeutic Level Labs: No results found for: "LITHIUM" No results found for: "VALPROATE" No results found for: "CBMZ"  Current Medications: Current Outpatient Medications  Medication Sig Dispense Refill   Abaloparatide (TYMLOS) 3120 MCG/1.56ML SOPN Inject into the skin.     albuterol (VENTOLIN HFA) 108 (90 Base) MCG/ACT inhaler Inhale into the lungs.      Biotin 1000 MCG tablet Take by mouth.     Cholecalciferol (D 1000) 25 MCG (1000 UT) capsule Take by mouth.     Evolocumab (REPATHA SURECLICK) 140 MG/ML SOAJ Inject into the skin.     ferrous sulfate 325 (65 FE) MG tablet Take by mouth.     fluticasone (FLONASE) 50 MCG/ACT nasal spray SPRAY 2 SPRAY(S) EVERY 12 HOURS BY INTRANASAL ROUTE FOR 30 DAYS.     folic acid (FOLVITE) 800 MCG tablet Take by mouth.     gabapentin (NEURONTIN) 400 MG capsule Take by mouth.     insulin glargine (LANTUS SOLOSTAR) 100 UNIT/ML Solostar Pen      losartan (COZAAR) 100 MG tablet TAKE 1 TABLET BY MOUTH EVERY DAY FOR BLOOD PRESSURE     magnesium oxide (MAG-OX) 400 MG tablet Take by mouth.     meclizine (ANTIVERT) 25 MG tablet TAKE 1 TABLET (25 MG) BY MOUTH ONCE DAILY AS NEEDED     montelukast (SINGULAIR) 10 MG tablet  TAKE 1 TABLET BY MOUTH EVERY DAY IN THE EVENING FOR ALLERGIES     pantoprazole (PROTONIX) 40 MG tablet Take by mouth.     Semaglutide,0.25 or 0.5MG /DOS, (OZEMPIC, 0.25 OR 0.5 MG/DOSE,) 2 MG/3ML SOPN      buprenorphine-naloxone (SUBOXONE) 8-2 mg SUBL SL tablet Place under the tongue.     celecoxib (CELEBREX) 200 MG capsule Take 1 capsule (200 mg total) by mouth 2 (two) times daily. 30 capsule 0   Cyanocobalamin 2500 MCG SUBL Place under the tongue.     HYDROcodone-acetaminophen (NORCO/VICODIN) 5-325 MG per tablet Take 1 tablet by mouth every 6 (six) hours as needed for moderate pain. 15 tablet 0   Multiple Vitamin (MULTI-VITAMIN) tablet      No current facility-administered medications for this visit.    Medication Side Effects: none  Orders placed this visit:  No orders of the defined types were placed in this encounter.   Psychiatric Specialty Exam:  Review of Systems  Musculoskeletal:  Negative for gait problem.  Neurological:  Negative for tremors.  Psychiatric/Behavioral:         Please refer to HPI    Height 5\' 3"  (1.6 m), weight 162 lb (73.5 kg).Body mass index is 28.7 kg/m.   General Appearance: Casual and Neat  Eye Contact:  Good  Speech:  Clear and Coherent and Normal Rate  Volume:  Normal  Mood:  Euthymic  Affect:  Appropriate and Congruent  Thought Process:  Coherent and Descriptions of Associations: Intact  Orientation:  Full (Time, Place, and Person)  Thought Content: Logical   Suicidal Thoughts:  No  Homicidal Thoughts:  No  Memory:  WNL  Judgement:  Negative  Insight:  Good  Psychomotor Activity:  Normal  Concentration:  Concentration: Good and Attention Span: Good  Recall:  Good  Fund of Knowledge: Good  Language: Good  Assets:  Communication Skills Desire for Improvement Financial Resources/Insurance Housing Intimacy Leisure Time Physical Health Resilience Social Support Talents/Skills Transportation Vocational/Educational  ADL's:  Intact  Cognition: WNL  Prognosis:  Good   Screenings:  None - video  Receiving Psychotherapy: Yes   Treatment Plan/Recommendations:   Plan:  PDMP reviewed  Continue Adderall 5mg  TID - 136/75/71  Therapist - Elisa Stoneham  RTC 4 weeks  Discussed potential benefits, risks, and side effects of stimulants with patient to include increased heart rate, palpitations, insomnia, increased anxiety, increased irritability, or decreased appetite.  Instructed patient to contact office if experiencing any significant tolerability issues.   Patient advised to contact office with any questions, adverse effects, or acute worsening in signs and symptoms.      Dorothyann Gibbs, NP

## 2023-06-07 ENCOUNTER — Telehealth: Payer: Self-pay | Admitting: Adult Health

## 2023-06-07 NOTE — Telephone Encounter (Signed)
Pt called requesting Rx for generic adderall 15 mg 3[facility]d [facility]. Apt need 4 wk f/u LVM to Pt call for 4 wk f/u apt

## 2023-06-07 NOTE — Telephone Encounter (Signed)
New patient, has only been seen once, RTC in 4 weeks but no appt scheduled. Need appt to send RF.

## 2023-06-08 NOTE — Telephone Encounter (Signed)
Patient filled Adderall yesterday, it was written for #90, 1 every day, but it should be TID. Patient reports she usually gets an orange pill and this fill is different. She feels like it isn't working at all. Told her I could call the pharmacy and have them put requested manufacturer on her profile, but no guarantee that they could get supply.

## 2023-06-08 NOTE — Telephone Encounter (Signed)
Called pharmacy and they said they are unable to get from the requested manufacturer currently, can only get from [employer] and even if they request a different manufacturer all they can get is [employer].

## 2023-06-08 NOTE — Telephone Encounter (Signed)
Apt 8/12

## 2023-06-09 NOTE — Telephone Encounter (Signed)
LVM with info

## 2023-06-27 ENCOUNTER — Telehealth (INDEPENDENT_AMBULATORY_CARE_PROVIDER_SITE_OTHER): Payer: Medicare HMO | Admitting: Adult Health

## 2023-06-27 ENCOUNTER — Encounter: Payer: Self-pay | Admitting: Adult Health

## 2023-06-27 DIAGNOSIS — F909 Attention-deficit hyperactivity disorder, unspecified type: Secondary | ICD-10-CM

## 2023-06-27 DIAGNOSIS — F902 Attention-deficit hyperactivity disorder, combined type: Secondary | ICD-10-CM

## 2023-06-27 MED ORDER — AMPHETAMINE-DEXTROAMPHETAMINE 5 MG PO TABS: 5.0000 mg | ORAL_TABLET | Freq: Three times a day (TID) | ORAL | 0 refills | Status: DC

## 2023-06-27 NOTE — Progress Notes (Signed)
Bridget Johnson [id number] 1956-08-22 67 y.o.  Virtual Visit via Video Note  I connected with pt @ on 06/27/23 at  1:40 PM EDT by a video enabled telemedicine application and verified that I am speaking with the correct person using two identifiers.   I discussed the limitations of evaluation and management by telemedicine and the availability of in person appointments. The patient expressed understanding and agreed to proceed.  I discussed the assessment and treatment plan with the patient. The patient was provided an opportunity to ask questions and all were answered. The patient agreed with the plan and demonstrated an understanding of the instructions.   The patient was advised to call back or seek an in-person evaluation if the symptoms worsen or if the condition fails to improve as anticipated.  I provided 10 minutes of non-face-to-face time during this encounter.  The patient was located at home.  The provider was located at [facility].   Dorothyann Gibbs, NP   Subjective:   Patient ID:  Bridget Johnson is a 67 y.o. (DOB 1956/10/03) female.  Chief Complaint: No chief complaint on file.   HPI Janiece Pion presents for follow-up of ADD  Describes mood today as "ok". Pleasant. Denies tearfulness. Mood symptoms - denies depression, anxiety, and irritability. Denies panic attacks. Denies worry, rumination, and over thinking. Mood is consistent. Stating "I feel like I'm doing ok". Feels like medication works well for her. Reports several health issues and is followed by [facility] - Andris Baumann. Stable interest and motivation. Taking medications as prescribed.  Energy levels vary. Active, does not have a regular exercise routine - difficulties walking.   Enjoys some usual interests and activities. Widowed. Lives alone. Has 2 sons. Spending time with family. Appetite adequate. Weight stable. Sleeps is stable. Averages 5 to 6 hours. Naps during the day. Does not have a  CPAP machine - working on getting one. Focus and concentration stable. Diagnosed with ADD in her thirties. Reports being trialed on various medications - feels like the Adderall. Completing tasks. Managing aspects of household.  Denies SI or HI.  Denies AH or VH. Denies self harm. Denies substance use.  Previous medication trials:  Vyvanse,  Review of Systems:  Review of Systems  Musculoskeletal:  Negative for gait problem.  Neurological:  Negative for tremors.  Psychiatric/Behavioral:         Please refer to HPI    Medications: I have reviewed the patient's current medications.  Current Outpatient Medications  Medication Sig Dispense Refill   Abaloparatide (TYMLOS) 3120 MCG/1.56ML SOPN Inject into the skin.     albuterol (VENTOLIN HFA) 108 (90 Base) MCG/ACT inhaler Inhale into the lungs.     amphetamine-dextroamphetamine (ADDERALL) 5 MG tablet Take 1 tablet (5 mg total) by mouth daily. 90 tablet 0   Biotin 1000 MCG tablet Take by mouth.     buprenorphine-naloxone (SUBOXONE) 8-2 mg SUBL SL tablet Place under the tongue.     celecoxib (CELEBREX) 200 MG capsule Take 1 capsule (200 mg total) by mouth 2 (two) times daily. 30 capsule 0   Cholecalciferol (D 1000) 25 MCG (1000 UT) capsule Take by mouth.     Cyanocobalamin 2500 MCG SUBL Place under the tongue.     Evolocumab (REPATHA SURECLICK) 140 MG/ML SOAJ Inject into the skin.     ferrous sulfate 325 (65 FE) MG tablet Take by mouth.     fluticasone (FLONASE) 50 MCG/ACT nasal spray SPRAY 2 SPRAY(S) EVERY 12 HOURS BY INTRANASAL ROUTE  FOR 30 DAYS.     folic acid (FOLVITE) 800 MCG tablet Take by mouth.     gabapentin (NEURONTIN) 400 MG capsule Take by mouth.     HYDROcodone-acetaminophen (NORCO/VICODIN) 5-325 MG per tablet Take 1 tablet by mouth every 6 (six) hours as needed for moderate pain. 15 tablet 0   insulin glargine (LANTUS SOLOSTAR) 100 UNIT/ML Solostar Pen      losartan (COZAAR) 100 MG tablet TAKE 1 TABLET BY MOUTH EVERY DAY FOR  BLOOD PRESSURE     magnesium oxide (MAG-OX) 400 MG tablet Take by mouth.     meclizine (ANTIVERT) 25 MG tablet TAKE 1 TABLET (25 MG) BY MOUTH ONCE DAILY AS NEEDED     montelukast (SINGULAIR) 10 MG tablet TAKE 1 TABLET BY MOUTH EVERY DAY IN THE EVENING FOR ALLERGIES     Multiple Vitamin (MULTI-VITAMIN) tablet      pantoprazole (PROTONIX) 40 MG tablet Take by mouth.     Semaglutide,0.25 or 0.5MG /DOS, (OZEMPIC, 0.25 OR 0.5 MG/DOSE,) 2 MG/3ML SOPN      No current facility-administered medications for this visit.    Medication Side Effects: None  Allergies:  Allergies  Allergen Reactions   Amitriptyline Other (See Comments)    confusion   Duloxetine Other (See Comments)    drowsiness  Other reaction(s): Lethargy (intolerance)  drowsiness   Cyclobenzaprine Other (See Comments)    Muscle jerks  Muscle jerks  Muscle jerks   Fluconazole Other (See Comments)    Muscle jerks  Muscle jerks  Muscle jerks   Linagliptin Other (See Comments)    Joint pain/muscle pain  Joint pain/muscle pain  Joint pain/muscle pain   Simvastatin Other (See Comments)    Muscle pain  Muscle pain  Muscle pain   Trazodone And Nefazodone Other (See Comments)    States she has many other allergies and does not know name  Muscle jerks  Muscle jerks  States she has many other allergies and does not know name  Muscle jerks   Vilazodone Other (See Comments)    Muscle jerks  Muscle jerks  Muscle jerks    Past Medical History:  Diagnosis Date   COPD (chronic obstructive pulmonary disease) (HCC)    Diabetes mellitus without complication (HCC)    Hypertension    Renal disorder    Thyroid disease     No family history on file.  Social History   Socioeconomic History   Marital status: Widowed    Spouse name: Not on file   Number of children: Not on file   Years of education: Not on file   Highest education level: Not on file  Occupational History   Not on file  Tobacco Use   Smoking  status: Every Day    Types: Cigarettes   Smokeless tobacco: Never  Vaping Use   Vaping status: Never Used  Substance and Sexual Activity   Alcohol use: No   Drug use: No   Sexual activity: Not on file  Other Topics Concern   Not on file  Social History Narrative   Not on file   Social Determinants of Health   Financial Resource Strain: Not on file  Food Insecurity: Low Risk  (04/24/2023)   Received from [facility]   Food vital sign    Within the past 12 months, you worried that your food would run out before you got money to buy more: Never true    Within the past 12 months, the food you bought just didn't  last and you didn't have money to get more. : Never true  Transportation Needs: Not on file (04/17/2023)  Physical Activity: Not on file  Stress: Not on file  Social Connections: Not on file  Intimate Partner Violence: Not on file    Past Medical History, Surgical history, Social history, and Family history were reviewed and updated as appropriate.   Please see review of systems for further details on the patient's review from today.   Objective:   Physical Exam:  There were no vitals taken for this visit.  Physical Exam Constitutional:      General: She is not in acute distress. Musculoskeletal:        General: No deformity.  Neurological:     Mental Status: She is alert and oriented to person, place, and time.     Coordination: Coordination normal.  Psychiatric:        Attention and Perception: Attention and perception normal. She does not perceive auditory or visual hallucinations.        Mood and Affect: Mood normal. Mood is not anxious or depressed. Affect is not labile, blunt, angry or inappropriate.        Speech: Speech normal.        Behavior: Behavior normal.        Thought Content: Thought content normal. Thought content is not paranoid or delusional. Thought content does not include homicidal or suicidal ideation. Thought content does not include  homicidal or suicidal plan.        Cognition and Memory: Cognition and memory normal.        Judgment: Judgment normal.     Comments: Insight intact     Lab Review:     Component Value Date/Time   NA 134 (L) 10/18/2018 1224   K 3.6 10/18/2018 1224   CL 99 10/18/2018 1224   CO2 26 10/18/2018 1224   GLUCOSE 213 (H) 10/18/2018 1224   BUN 8 10/18/2018 1224   CREATININE 0.74 10/18/2018 1224   CALCIUM 8.9 10/18/2018 1224   PROT 6.8 10/18/2018 1224   ALBUMIN 3.6 10/18/2018 1224   AST 16 10/18/2018 1224   ALT 21 10/18/2018 1224   ALKPHOS 122 10/18/2018 1224   BILITOT 0.3 10/18/2018 1224   GFRNONAA >60 10/18/2018 1224   GFRAA >60 10/18/2018 1224       Component Value Date/Time   WBC 9.9 10/18/2018 1224   RBC 4.92 10/18/2018 1224   HGB 14.4 10/18/2018 1224   HCT 44.8 10/18/2018 1224   PLT 198 10/18/2018 1224   MCV 91.1 10/18/2018 1224   MCH 29.3 10/18/2018 1224   MCHC 32.1 10/18/2018 1224   RDW 13.3 10/18/2018 1224   LYMPHSABS 2.5 10/18/2018 1224   MONOABS 0.7 10/18/2018 1224   EOSABS 0.1 10/18/2018 1224   BASOSABS 0.0 10/18/2018 1224    No results found for: "POCLITH", "LITHIUM"   No results found for: "PHENYTOIN", "PHENOBARB", "VALPROATE", "CBMZ"   .res Assessment: Plan:    Plan:  PDMP reviewed  Adderall 5mg  TID - 136/75/71  Therapist - Luci Bank  RTC 3 months  Discussed potential benefits, risks, and side effects of stimulants with patient to include increased heart rate, palpitations, insomnia, increased anxiety, increased irritability, or decreased appetite.  Instructed patient to contact office if experiencing any significant tolerability issues.   Patient advised to contact office with any questions, adverse effects, or acute worsening in signs and symptoms.   There are no diagnoses linked to this encounter.   Please see  After Visit Summary for patient specific instructions.  Future Appointments  Date Time Provider Department Center  06/27/2023   1:40 PM Mael Delap, Thereasa Solo, NP CP-CP None    No orders of the defined types were placed in this encounter.     -------------------------------

## 2023-07-07 ENCOUNTER — Other Ambulatory Visit: Payer: Self-pay

## 2023-07-07 ENCOUNTER — Telehealth: Payer: Self-pay | Admitting: Adult Health

## 2023-07-07 MED ORDER — AMPHETAMINE-DEXTROAMPHETAMINE 15 MG PO TABS: 15.0000 mg | ORAL_TABLET | Freq: Three times a day (TID) | ORAL | 0 refills | Status: DC

## 2023-07-07 NOTE — Telephone Encounter (Signed)
Patient called back into office and requested that Adderall 15mg  [facility] instead [facility] [address] [place], [place] Ph: [phone] Appt

## 2023-07-07 NOTE — Telephone Encounter (Signed)
Pended.

## 2023-07-07 NOTE — Telephone Encounter (Signed)
Pt called and said that she takes adderall 15 mg 3 times a day. Gina sent in 5 mg 3x day. Please cancel all 5 mg scripts and send in adderall 15 mg 3 x daily

## 2023-07-07 NOTE — Telephone Encounter (Signed)
Addendum to previous messages. Bridget Johnson called back and said that [facility] [address] [facility]'t [facility] in [facility].

## 2023-07-07 NOTE — Telephone Encounter (Signed)
Rx had already been sent. Will await another pharmacy.

## 2023-07-07 NOTE — Telephone Encounter (Signed)
Addendum the pharmacy patient wants prescription for Adderall 15mg  [facility] [address] [place], [place]

## 2023-08-08 ENCOUNTER — Other Ambulatory Visit: Payer: Self-pay

## 2023-08-08 ENCOUNTER — Telehealth: Payer: Self-pay | Admitting: Adult Health

## 2023-08-08 MED ORDER — AMPHETAMINE-DEXTROAMPHETAMINE 15 MG PO TABS: 15.0000 mg | ORAL_TABLET | Freq: Three times a day (TID) | ORAL | 0 refills | Status: DC

## 2023-08-08 NOTE — Telephone Encounter (Signed)
PENDED

## 2023-08-08 NOTE — Telephone Encounter (Signed)
Pt called today to request RF on Adderall 15mg  9 3 per day) . She would like it sent to different pharmacy- [facility] on [address], [place] Appt 11/12

## 2023-08-10 ENCOUNTER — Other Ambulatory Visit: Payer: Self-pay

## 2023-08-10 MED ORDER — AMPHETAMINE-DEXTROAMPHETAMINE 15 MG PO TABS: 15.0000 mg | ORAL_TABLET | Freq: Three times a day (TID) | ORAL | 0 refills | Status: DC

## 2023-08-10 NOTE — Telephone Encounter (Signed)
Has this been seen? She is worried that it won't get called in in time.

## 2023-08-10 NOTE — Telephone Encounter (Signed)
Please cancel Rx sent to [facility] on 9/23

## 2023-08-10 NOTE — Telephone Encounter (Signed)
Patient called back regarding previous messages she is very concerned that pharmacy will run out before prescription is sent. Ph: [phone] Appt 11/12 [facility] [address] [place], [place]

## 2023-08-10 NOTE — Telephone Encounter (Signed)
Pt called back at 4:57p.  [facility] doesn't have it in stock. [facility] [address] [facility].

## 2023-08-11 NOTE — Telephone Encounter (Signed)
Looking back- this is the pharmacy it was sent to 09/25  [id number] [place], [place] - [address], [place] [address] Phone: [phone]  Fax: [phone]   It does not match the pharmacy in previous not from [place].

## 2023-08-11 NOTE — Telephone Encounter (Signed)
Called pharmacy to cancel, spoke with pharmacy tech, she then transferred me and the phone kept ringing and ended the call; will call again to ensure rx has been canceled. RX was ordered yesterday to requested pharmacy.

## 2023-09-07 ENCOUNTER — Other Ambulatory Visit: Payer: Self-pay

## 2023-09-07 ENCOUNTER — Telehealth: Payer: Self-pay | Admitting: Adult Health

## 2023-09-07 MED ORDER — AMPHETAMINE-DEXTROAMPHETAMINE 15 MG PO TABS: 15.0000 mg | ORAL_TABLET | Freq: Three times a day (TID) | ORAL | 0 refills | Status: DC

## 2023-09-07 NOTE — Telephone Encounter (Signed)
PENDED

## 2023-09-07 NOTE — Telephone Encounter (Signed)
Pt called at 1:10p requesting refill of generic Adderall to   [id number] [place], [place] - [address], [place] [address] Phone: [phone]  Fax: [phone]   Next appt 11/12

## 2023-09-27 ENCOUNTER — Telehealth: Payer: Medicare HMO | Admitting: Adult Health

## 2023-09-27 ENCOUNTER — Encounter: Payer: Self-pay | Admitting: Adult Health

## 2023-09-27 DIAGNOSIS — F902 Attention-deficit hyperactivity disorder, combined type: Secondary | ICD-10-CM

## 2023-09-27 MED ORDER — AMPHETAMINE-DEXTROAMPHETAMINE 15 MG PO TABS: 15.0000 mg | ORAL_TABLET | Freq: Three times a day (TID) | ORAL | 0 refills | Status: DC

## 2023-09-27 NOTE — Progress Notes (Signed)
Bridget Johnson [id number] 1956-05-20 67 y.o.  Virtual Visit via Video Note  I connected with pt @ on 09/27/23 at  1:00 PM EST by a video enabled telemedicine application and verified that I am speaking with the correct person using two identifiers.   I discussed the limitations of evaluation and management by telemedicine and the availability of in person appointments. The patient expressed understanding and agreed to proceed.  I discussed the assessment and treatment plan with the patient. The patient was provided an opportunity to ask questions and all were answered. The patient agreed with the plan and demonstrated an understanding of the instructions.   The patient was advised to call back or seek an in-person evaluation if the symptoms worsen or if the condition fails to improve as anticipated.  I provided 10 minutes of non-face-to-face time during this encounter.  The patient was located at home.  The provider was located at [facility].   Dorothyann Gibbs, NP   Subjective:   Patient ID:  Bridget Johnson is a 67 y.o. (DOB 1956-01-18) female.  Chief Complaint: No chief complaint on file.   HPI Bridget Johnson presents for follow-up of ADD.  Describes mood today as "ok". Pleasant. Denies tearfulness. Mood symptoms - denies depression, anxiety, and irritability. Denies panic attacks. Denies worry, rumination, and over thinking. Mood is stable. Stating "I feel like I'm doing pretty good". Feels like medication works well for her. Reports several health issues and is followed by [facility] - Andris Baumann. Stable interest and motivation. Taking medications as prescribed.  Energy levels vary. Active, does not have a regular exercise routine - difficulties walking.   Enjoys some usual interests and activities. Widowed. Lives alone. Has 2 sons. Spending time with family. Appetite adequate. Weight stable. Sleeps is stable. Averages 5 to 6 hours. Reports napping during the day.  Does not have a CPAP machine - working on getting one. Focus and concentration stable. Diagnosed with ADD in her thirties. Reports being trialed on various medications - feels like the Adderall. Completing tasks. Managing aspects of household.  Denies SI or HI.  Denies AH or VH. Denies self harm. Denies substance use.  Previous medication trials:  Vyvanse,   Review of Systems:  Review of Systems  Musculoskeletal:  Negative for gait problem.  Neurological:  Negative for tremors.  Psychiatric/Behavioral:         Please refer to HPI    Medications: I have reviewed the patient's current medications.  Current Outpatient Medications  Medication Sig Dispense Refill   [START ON 10/25/2023] amphetamine-dextroamphetamine (ADDERALL) 15 MG tablet Take 1 tablet by mouth 3 (three) times daily. 90 tablet 0   [START ON 11/22/2023] amphetamine-dextroamphetamine (ADDERALL) 15 MG tablet Take 1 tablet by mouth 3 (three) times daily. 90 tablet 0   Abaloparatide (TYMLOS) 3120 MCG/1.56ML SOPN Inject into the skin.     albuterol (VENTOLIN HFA) 108 (90 Base) MCG/ACT inhaler Inhale into the lungs.     amphetamine-dextroamphetamine (ADDERALL) 15 MG tablet Take 1 tablet by mouth 3 (three) times daily. 90 tablet 0   Biotin 1000 MCG tablet Take by mouth.     buprenorphine-naloxone (SUBOXONE) 8-2 mg SUBL SL tablet Place under the tongue.     celecoxib (CELEBREX) 200 MG capsule Take 1 capsule (200 mg total) by mouth 2 (two) times daily. 30 capsule 0   Cholecalciferol (D 1000) 25 MCG (1000 UT) capsule Take by mouth.     Cyanocobalamin 2500 MCG SUBL Place under the tongue.  Evolocumab (REPATHA SURECLICK) 140 MG/ML SOAJ Inject into the skin.     ferrous sulfate 325 (65 FE) MG tablet Take by mouth.     fluticasone (FLONASE) 50 MCG/ACT nasal spray SPRAY 2 SPRAY(S) EVERY 12 HOURS BY INTRANASAL ROUTE FOR 30 DAYS.     folic acid (FOLVITE) 800 MCG tablet Take by mouth.     gabapentin (NEURONTIN) 400 MG capsule Take by  mouth.     HYDROcodone-acetaminophen (NORCO/VICODIN) 5-325 MG per tablet Take 1 tablet by mouth every 6 (six) hours as needed for moderate pain. 15 tablet 0   insulin glargine (LANTUS SOLOSTAR) 100 UNIT/ML Solostar Pen      losartan (COZAAR) 100 MG tablet TAKE 1 TABLET BY MOUTH EVERY DAY FOR BLOOD PRESSURE     magnesium oxide (MAG-OX) 400 MG tablet Take by mouth.     meclizine (ANTIVERT) 25 MG tablet TAKE 1 TABLET (25 MG) BY MOUTH ONCE DAILY AS NEEDED     montelukast (SINGULAIR) 10 MG tablet TAKE 1 TABLET BY MOUTH EVERY DAY IN THE EVENING FOR ALLERGIES     Multiple Vitamin (MULTI-VITAMIN) tablet      pantoprazole (PROTONIX) 40 MG tablet Take by mouth.     Semaglutide,0.25 or 0.5MG /DOS, (OZEMPIC, 0.25 OR 0.5 MG/DOSE,) 2 MG/3ML SOPN      No current facility-administered medications for this visit.    Medication Side Effects: None  Allergies:  Allergies  Allergen Reactions   Amitriptyline Other (See Comments)    confusion   Duloxetine Other (See Comments)    drowsiness  Other reaction(s): Lethargy (intolerance)  drowsiness   Cyclobenzaprine Other (See Comments)    Muscle jerks  Muscle jerks  Muscle jerks   Fluconazole Other (See Comments)    Muscle jerks  Muscle jerks  Muscle jerks   Linagliptin Other (See Comments)    Joint pain/muscle pain  Joint pain/muscle pain  Joint pain/muscle pain   Simvastatin Other (See Comments)    Muscle pain  Muscle pain  Muscle pain   Trazodone And Nefazodone Other (See Comments)    States she has many other allergies and does not know name  Muscle jerks  Muscle jerks  States she has many other allergies and does not know name  Muscle jerks   Vilazodone Other (See Comments)    Muscle jerks  Muscle jerks  Muscle jerks    Past Medical History:  Diagnosis Date   COPD (chronic obstructive pulmonary disease) (HCC)    Diabetes mellitus without complication (HCC)    Hypertension    Renal disorder    Thyroid disease     No  family history on file.  Social History   Socioeconomic History   Marital status: Widowed    Spouse name: Not on file   Number of children: Not on file   Years of education: Not on file   Highest education level: Not on file  Occupational History   Not on file  Tobacco Use   Smoking status: Every Day    Types: Cigarettes   Smokeless tobacco: Never  Vaping Use   Vaping status: Never Used  Substance and Sexual Activity   Alcohol use: No   Drug use: No   Sexual activity: Not on file  Other Topics Concern   Not on file  Social History Narrative   Not on file   Social Determinants of Health   Financial Resource Strain: Not on file  Food Insecurity: Low Risk  (04/24/2023)   Received from [facility]  Hunger Vital Sign    Worried About Running Out of Food in the Last Year: Never true    Ran Out of Food in the Last Year: Never true  Transportation Needs: Not on file (04/17/2023)  Physical Activity: Not on file  Stress: Not on file  Social Connections: Not on file  Intimate Partner Violence: Not on file    Past Medical History, Surgical history, Social history, and Family history were reviewed and updated as appropriate.   Please see review of systems for further details on the patient's review from today.   Objective:   Physical Exam:  There were no vitals taken for this visit.  Physical Exam Constitutional:      General: She is not in acute distress. Musculoskeletal:        General: No deformity.  Neurological:     Mental Status: She is alert and oriented to person, place, and time.     Coordination: Coordination normal.  Psychiatric:        Attention and Perception: Attention and perception normal. She does not perceive auditory or visual hallucinations.        Mood and Affect: Mood normal. Mood is not anxious or depressed. Affect is not labile, blunt, angry or inappropriate.        Speech: Speech normal.        Behavior: Behavior normal.        Thought  Content: Thought content normal. Thought content is not paranoid or delusional. Thought content does not include homicidal or suicidal ideation. Thought content does not include homicidal or suicidal plan.        Cognition and Memory: Cognition and memory normal.        Judgment: Judgment normal.     Comments: Insight intact     Lab Review:     Component Value Date/Time   NA 134 (L) 10/18/2018 1224   K 3.6 10/18/2018 1224   CL 99 10/18/2018 1224   CO2 26 10/18/2018 1224   GLUCOSE 213 (H) 10/18/2018 1224   BUN 8 10/18/2018 1224   CREATININE 0.74 10/18/2018 1224   CALCIUM 8.9 10/18/2018 1224   PROT 6.8 10/18/2018 1224   ALBUMIN 3.6 10/18/2018 1224   AST 16 10/18/2018 1224   ALT 21 10/18/2018 1224   ALKPHOS 122 10/18/2018 1224   BILITOT 0.3 10/18/2018 1224   GFRNONAA >60 10/18/2018 1224   GFRAA >60 10/18/2018 1224       Component Value Date/Time   WBC 9.9 10/18/2018 1224   RBC 4.92 10/18/2018 1224   HGB 14.4 10/18/2018 1224   HCT 44.8 10/18/2018 1224   PLT 198 10/18/2018 1224   MCV 91.1 10/18/2018 1224   MCH 29.3 10/18/2018 1224   MCHC 32.1 10/18/2018 1224   RDW 13.3 10/18/2018 1224   LYMPHSABS 2.5 10/18/2018 1224   MONOABS 0.7 10/18/2018 1224   EOSABS 0.1 10/18/2018 1224   BASOSABS 0.0 10/18/2018 1224    No results found for: "POCLITH", "LITHIUM"   No results found for: "PHENYTOIN", "PHENOBARB", "VALPROATE", "CBMZ"   .res Assessment: Plan:    Plan:  PDMP reviewed  Adderall 15mg  TID - 136/75/71  Therapist - Luci Bank  RTC 3 months  Discussed potential benefits, risks, and side effects of stimulants with patient to include increased heart rate, palpitations, insomnia, increased anxiety, increased irritability, or decreased appetite.  Instructed patient to contact office if experiencing any significant tolerability issues.   Patient advised to contact office with any questions, adverse effects, or acute  worsening in signs and symptoms.   Diagnoses  and all orders for this visit:  Attention deficit hyperactivity disorder (ADHD), combined type -     amphetamine-dextroamphetamine (ADDERALL) 15 MG tablet; Take 1 tablet by mouth 3 (three) times daily. -     amphetamine-dextroamphetamine (ADDERALL) 15 MG tablet; Take 1 tablet by mouth 3 (three) times daily. -     amphetamine-dextroamphetamine (ADDERALL) 15 MG tablet; Take 1 tablet by mouth 3 (three) times daily.     Please see After Visit Summary for patient specific instructions.  No future appointments.   No orders of the defined types were placed in this encounter.     -------------------------------

## 2023-10-05 ENCOUNTER — Telehealth: Payer: Self-pay | Admitting: Adult Health

## 2023-10-05 NOTE — Telephone Encounter (Signed)
Patient reported pharmacy will not release her generic Adderall refill. They're  requesting the office ok the refill due to the patient being on Buprenorphine also.

## 2023-10-05 NOTE — Telephone Encounter (Signed)
Please see the message and advise. 

## 2023-10-06 ENCOUNTER — Telehealth: Payer: Self-pay | Admitting: Adult Health

## 2023-10-06 NOTE — Telephone Encounter (Signed)
Called pharmacy to ok fill on Adderall. Patient has called multiple times today asking to send to another pharmacy. Pharmacy has medication in stock, will fill today. Will notify patient.

## 2023-10-06 NOTE — Telephone Encounter (Signed)
Pt states that her current pharmacy is our of Adderall. Can we please [facility] [address], [place], [place]. Is in stock there.

## 2023-10-06 NOTE — Telephone Encounter (Signed)
Pt LVM @ 11:56p.  She said she can't find generic Adderall anywhere in [place].  She is a requesting a call back to discuss an alternative medication.  No upcoming appt scheduled.

## 2023-10-06 NOTE — Telephone Encounter (Signed)
They won't release it because patient is also on buprenorphine.  If ok I'll call pharmacy.

## 2023-10-06 NOTE — Telephone Encounter (Signed)
This has been addressed in another note.

## 2023-10-06 NOTE — Telephone Encounter (Signed)
Patient lvm at 11:01 stating that [facility] [address] [facility] and needs [facility] [address] [place], [place]

## 2023-11-02 ENCOUNTER — Telehealth: Payer: Self-pay | Admitting: Adult Health

## 2023-11-02 ENCOUNTER — Other Ambulatory Visit: Payer: Self-pay

## 2023-11-02 DIAGNOSIS — F902 Attention-deficit hyperactivity disorder, combined type: Secondary | ICD-10-CM

## 2023-11-02 MED ORDER — AMPHETAMINE-DEXTROAMPHETAMINE 15 MG PO TABS: 15.0000 mg | ORAL_TABLET | Freq: Three times a day (TID) | ORAL | 0 refills | Status: DC

## 2023-11-02 NOTE — Telephone Encounter (Signed)
Addendum to attached message.   Please delete the following pharmacy.  [id number] - [place], [place] - [address] AT [facility] [address]   Phone: [phone]  Fax: [phone]    [facility] [address], [place], [place] [address]. Phone number [phone].

## 2023-11-02 NOTE — Telephone Encounter (Signed)
Next visit is 12/05/23. Bridget Johnson is requesting a refill on her generic Adderall. It is in stock.

## 2023-11-02 NOTE — Telephone Encounter (Signed)
Canceled org. Rx's at previous pharmacy and pended Adderall 15 mg to requested pharmacy with start date of 12/19

## 2023-12-01 ENCOUNTER — Other Ambulatory Visit: Payer: Self-pay

## 2023-12-01 ENCOUNTER — Telehealth: Payer: Self-pay | Admitting: Adult Health

## 2023-12-01 DIAGNOSIS — F902 Attention-deficit hyperactivity disorder, combined type: Secondary | ICD-10-CM

## 2023-12-01 MED ORDER — AMPHETAMINE-DEXTROAMPHETAMINE 15 MG PO TABS
15.0000 mg | ORAL_TABLET | Freq: Three times a day (TID) | ORAL | 0 refills | Status: DC
Start: 2023-12-01 — End: 2023-12-13

## 2023-12-01 NOTE — Telephone Encounter (Signed)
Pended adderall 15 mg to [facility] [address]

## 2023-12-01 NOTE — Telephone Encounter (Signed)
Pt has an appt 12/12/23. She needs a refill on her adderall 15 mg. [facility] [address] [facility] [place]

## 2023-12-05 ENCOUNTER — Ambulatory Visit: Payer: Medicare HMO | Admitting: Adult Health

## 2023-12-13 ENCOUNTER — Telehealth: Payer: Medicare HMO | Admitting: Adult Health

## 2023-12-13 ENCOUNTER — Encounter: Payer: Self-pay | Admitting: Adult Health

## 2023-12-13 DIAGNOSIS — F909 Attention-deficit hyperactivity disorder, unspecified type: Secondary | ICD-10-CM

## 2023-12-13 DIAGNOSIS — F902 Attention-deficit hyperactivity disorder, combined type: Secondary | ICD-10-CM

## 2023-12-13 MED ORDER — AMPHETAMINE-DEXTROAMPHETAMINE 15 MG PO TABS: 15.0000 mg | ORAL_TABLET | Freq: Three times a day (TID) | ORAL | 0 refills | Status: DC

## 2023-12-13 NOTE — Progress Notes (Signed)
Bridget Johnson [id number] 27-Nov-1955 68 y.o.  Virtual Visit via Video Note  I connected with pt @ on 12/13/23 at  1:30 PM EST by a video enabled telemedicine application and verified that I am speaking with the correct person using two identifiers.   I discussed the limitations of evaluation and management by telemedicine and the availability of in person appointments. The patient expressed understanding and agreed to proceed.  I discussed the assessment and treatment plan with the patient. The patient was provided an opportunity to ask questions and all were answered. The patient agreed with the plan and demonstrated an understanding of the instructions.   The patient was advised to call back or seek an in-person evaluation if the symptoms worsen or if the condition fails to improve as anticipated.  I provided 10 minutes of non-face-to-face time during this encounter.  The patient was located at home.  The provider was located at [facility].   Dorothyann Gibbs, NP   Subjective:   Patient ID:  Bridget Johnson is a 68 y.o. (DOB [birth year]) female.  Chief Complaint: No chief complaint on file.   HPI Bridget Johnson presents for follow-up of ADD.  Describes mood today as "ok". Pleasant. Denies tearfulness. Mood symptoms - denies depression, anxiety, and irritability. Denies panic attacks. Denies worry, rumination, and over thinking. Mood is consistent. Stating "I feel like I'm doing alright". Feels like medication works well for her. Reports several health issues and is followed by [facility] - Andris Baumann. Stable interest and motivation. Taking medications as prescribed.  Energy levels vary. Active, does not have a regular exercise routine - difficulties walking.   Enjoys some usual interests and activities. Widowed. Lives alone. Has 2 sons. Spending time with family. Appetite adequate. Weight stable. Sleeps is variable. Averages 5 to 6 hours. Reports napping during the day.  Does not have a CPAP machine. Focus and concentration stable. Diagnosed with ADD in her thirties. Reports being trialed on various medications - feels like the Adderall. Completing tasks. Managing aspects of household.  Denies SI or HI.  Denies AH or VH. Denies self harm. Denies substance use.  Previous medication trials:  Vyvanse    Review of Systems:  Review of Systems  Musculoskeletal:  Negative for gait problem.  Neurological:  Negative for tremors.  Psychiatric/Behavioral:         Please refer to HPI    Medications: I have reviewed the patient's current medications.  Current Outpatient Medications  Medication Sig Dispense Refill   Abaloparatide (TYMLOS) 3120 MCG/1.56ML SOPN Inject into the skin.     albuterol (VENTOLIN HFA) 108 (90 Base) MCG/ACT inhaler Inhale into the lungs.     amphetamine-dextroamphetamine (ADDERALL) 15 MG tablet Take 1 tablet by mouth 3 (three) times daily. 90 tablet 0   amphetamine-dextroamphetamine (ADDERALL) 15 MG tablet Take 1 tablet by mouth 3 (three) times daily. 90 tablet 0   amphetamine-dextroamphetamine (ADDERALL) 15 MG tablet Take 1 tablet by mouth 3 (three) times daily. 90 tablet 0   Biotin 1000 MCG tablet Take by mouth.     buprenorphine-naloxone (SUBOXONE) 8-2 mg SUBL SL tablet Place under the tongue.     celecoxib (CELEBREX) 200 MG capsule Take 1 capsule (200 mg total) by mouth 2 (two) times daily. 30 capsule 0   Cholecalciferol (D 1000) 25 MCG (1000 UT) capsule Take by mouth.     Cyanocobalamin 2500 MCG SUBL Place under the tongue.     Evolocumab (REPATHA SURECLICK) 140 MG/ML SOAJ Inject  into the skin.     ferrous sulfate 325 (65 FE) MG tablet Take by mouth.     fluticasone (FLONASE) 50 MCG/ACT nasal spray SPRAY 2 SPRAY(S) EVERY 12 HOURS BY INTRANASAL ROUTE FOR 30 DAYS.     folic acid (FOLVITE) 800 MCG tablet Take by mouth.     gabapentin (NEURONTIN) 400 MG capsule Take by mouth.     HYDROcodone-acetaminophen (NORCO/VICODIN) 5-325 MG per  tablet Take 1 tablet by mouth every 6 (six) hours as needed for moderate pain. 15 tablet 0   insulin glargine (LANTUS SOLOSTAR) 100 UNIT/ML Solostar Pen      losartan (COZAAR) 100 MG tablet TAKE 1 TABLET BY MOUTH EVERY DAY FOR BLOOD PRESSURE     magnesium oxide (MAG-OX) 400 MG tablet Take by mouth.     meclizine (ANTIVERT) 25 MG tablet TAKE 1 TABLET (25 MG) BY MOUTH ONCE DAILY AS NEEDED     montelukast (SINGULAIR) 10 MG tablet TAKE 1 TABLET BY MOUTH EVERY DAY IN THE EVENING FOR ALLERGIES     Multiple Vitamin (MULTI-VITAMIN) tablet      pantoprazole (PROTONIX) 40 MG tablet Take by mouth.     Semaglutide,0.25 or 0.5MG /DOS, (OZEMPIC, 0.25 OR 0.5 MG/DOSE,) 2 MG/3ML SOPN      No current facility-administered medications for this visit.    Medication Side Effects: None  Allergies:  Allergies  Allergen Reactions   Amitriptyline Other (See Comments)    confusion   Duloxetine Other (See Comments)    drowsiness  Other reaction(s): Lethargy (intolerance)  drowsiness   Cyclobenzaprine Other (See Comments)    Muscle jerks  Muscle jerks  Muscle jerks   Fluconazole Other (See Comments)    Muscle jerks  Muscle jerks  Muscle jerks   Linagliptin Other (See Comments)    Joint pain/muscle pain  Joint pain/muscle pain  Joint pain/muscle pain   Simvastatin Other (See Comments)    Muscle pain  Muscle pain  Muscle pain   Trazodone And Nefazodone Other (See Comments)    States she has many other allergies and does not know name  Muscle jerks  Muscle jerks  States she has many other allergies and does not know name  Muscle jerks   Vilazodone Other (See Comments)    Muscle jerks  Muscle jerks  Muscle jerks    Past Medical History:  Diagnosis Date   COPD (chronic obstructive pulmonary disease) (HCC)    Diabetes mellitus without complication (HCC)    Hypertension    Renal disorder    Thyroid disease     No family history on file.  Social History   Socioeconomic History    Marital status: Widowed    Spouse name: Not on file   Number of children: Not on file   Years of education: Not on file   Highest education level: Not on file  Occupational History   Not on file  Tobacco Use   Smoking status: Every Day    Types: Cigarettes   Smokeless tobacco: Never  Vaping Use   Vaping status: Never Used  Substance and Sexual Activity   Alcohol use: No   Drug use: No   Sexual activity: Not on file  Other Topics Concern   Not on file  Social History Narrative   Not on file   Social Drivers of Health   Financial Resource Strain: Not on file  Food Insecurity: Low Risk  (04/24/2023)   Received from [facility]   Hunger Vital Sign  Worried About Programme researcher, broadcasting/film/video in the Last Year: Never true    Ran Out of Food in the Last Year: Never true  Transportation Needs: Not on file (04/17/2023)  Physical Activity: Not on file  Stress: Not on file  Social Connections: Not on file  Intimate Partner Violence: Not on file    Past Medical History, Surgical history, Social history, and Family history were reviewed and updated as appropriate.   Please see review of systems for further details on the patient's review from today.   Objective:   Physical Exam:  There were no vitals taken for this visit.  Physical Exam Constitutional:      General: She is not in acute distress. Musculoskeletal:        General: No deformity.  Neurological:     Mental Status: She is alert and oriented to person, place, and time.     Coordination: Coordination normal.  Psychiatric:        Attention and Perception: Attention and perception normal. She does not perceive auditory or visual hallucinations.        Mood and Affect: Affect is not labile, blunt, angry or inappropriate.        Speech: Speech normal.        Behavior: Behavior normal.        Thought Content: Thought content normal. Thought content is not paranoid or delusional. Thought content does not include homicidal or  suicidal ideation. Thought content does not include homicidal or suicidal plan.        Cognition and Memory: Cognition and memory normal.        Judgment: Judgment normal.     Comments: Insight intact     Lab Review:     Component Value Date/Time   NA 134 (L) 10/18/2018 1224   K 3.6 10/18/2018 1224   CL 99 10/18/2018 1224   CO2 26 10/18/2018 1224   GLUCOSE 213 (H) 10/18/2018 1224   BUN 8 10/18/2018 1224   CREATININE 0.74 10/18/2018 1224   CALCIUM 8.9 10/18/2018 1224   PROT 6.8 10/18/2018 1224   ALBUMIN 3.6 10/18/2018 1224   AST 16 10/18/2018 1224   ALT 21 10/18/2018 1224   ALKPHOS 122 10/18/2018 1224   BILITOT 0.3 10/18/2018 1224   GFRNONAA >60 10/18/2018 1224   GFRAA >60 10/18/2018 1224       Component Value Date/Time   WBC 9.9 10/18/2018 1224   RBC 4.92 10/18/2018 1224   HGB 14.4 10/18/2018 1224   HCT 44.8 10/18/2018 1224   PLT 198 10/18/2018 1224   MCV 91.1 10/18/2018 1224   MCH 29.3 10/18/2018 1224   MCHC 32.1 10/18/2018 1224   RDW 13.3 10/18/2018 1224   LYMPHSABS 2.5 10/18/2018 1224   MONOABS 0.7 10/18/2018 1224   EOSABS 0.1 10/18/2018 1224   BASOSABS 0.0 10/18/2018 1224    No results found for: "POCLITH", "LITHIUM"   No results found for: "PHENYTOIN", "PHENOBARB", "VALPROATE", "CBMZ"   .res Assessment: Plan:    Plan:  PDMP reviewed  Adderall 15mg  TID   Monitor BP between visits while taking stimulant medication - 130/80  Therapist - Elisa Stoneham  RTC 3 months  10 minutes spent dedicated to the care of this patient on the date of this encounter to include pre-visit review of records, ordering of medication, post visit documentation, and face-to-face time with the patient discussing ADD. Discussed continuing current treatment regimen.   Discussed potential benefits, risks, and side effects of stimulants with patient to  include increased heart rate, palpitations, insomnia, increased anxiety, increased irritability, or decreased appetite.   Instructed patient to contact office if experiencing any significant tolerability issues.   Patient advised to contact office with any questions, adverse effects, or acute worsening in signs and symptoms.  There are no diagnoses linked to this encounter.   Please see After Visit Summary for patient specific instructions.  No future appointments.  No orders of the defined types were placed in this encounter.     -------------------------------

## 2024-02-21 ENCOUNTER — Telehealth: Payer: Self-pay | Admitting: Adult Health

## 2024-02-21 NOTE — Telephone Encounter (Signed)
 Pt does have a RF available. Told her that Rx was written on 1/28 for start date of 3/25. Since she last filled 3/14 can fill this script and then she will be out of refills.

## 2024-02-21 NOTE — Telephone Encounter (Signed)
 Bridget Johnson called at 3:40 to request refill of her Adderall 15mg  tab.  Appt 4/24.  [facility] [address] [facility].  She said last filled 3/14.  If that is true she may still a prescription at the pharmacy but she said they told they didn't.  Please send in new script if needed.

## 2024-03-08 ENCOUNTER — Telehealth: Admitting: Adult Health

## 2024-03-08 ENCOUNTER — Encounter: Payer: Self-pay | Admitting: Adult Health

## 2024-03-08 DIAGNOSIS — F902 Attention-deficit hyperactivity disorder, combined type: Secondary | ICD-10-CM | POA: Diagnosis not present

## 2024-03-08 MED ORDER — AMPHETAMINE-DEXTROAMPHETAMINE 15 MG PO TABS: 15.0000 mg | ORAL_TABLET | Freq: Three times a day (TID) | ORAL | 0 refills | Status: DC

## 2024-03-08 NOTE — Progress Notes (Signed)
 Bridget Johnson [id number] 1955-12-16 68 y.o.  Virtual Visit via Video Note  I connected with pt @ on 03/08/24 at  2:00 PM EDT by a video enabled telemedicine application and verified that I am speaking with the correct person using two identifiers.   I discussed the limitations of evaluation and management by telemedicine and the availability of in person appointments. The patient expressed understanding and agreed to proceed.  I discussed the assessment and treatment plan with the patient. The patient was provided an opportunity to ask questions and all were answered. The patient agreed with the plan and demonstrated an understanding of the instructions.   The patient was advised to call back or seek an in-person evaluation if the symptoms worsen or if the condition fails to improve as anticipated.  I provided 20 minutes of non-face-to-face time during this encounter.  The patient was located at home.  The provider was located at [facility].   Reagan Camera, NP   Subjective:   Patient ID:  Bridget Johnson is a 68 y.o. (DOB 31-Jan-1956) female.  Chief Complaint: No chief complaint on file.   HPI Bridget Johnson presents for follow-up of ADD.  Describes mood today as "ok". Pleasant. Denies tearfulness. Mood symptoms - reports increased depression. Reports decreased interest and motivation. Denies anxiety and irritability. Denies panic attacks. Reports some worry, rumination and over thinking. Mood is lower. Stating "I don't feel as good as I did". Feels like the Adderall continues to work well. Taking medications as prescribed. Followed by [facility] - Bridget Johnson.  Energy levels vary. Active, does not have a regular exercise routine - difficulties walking.   Enjoys some usual interests and activities. Widowed. Lives alone. Has 2 sons. Spending time with family. Appetite adequate. Weight stable. Sleeps is variable. Averages 5 to 6 hours. Reports daytime napping. Does not  have a CPAP machine. Focus and concentration stable. Diagnosed with ADD in her thirties. Reports being trialed on various medications - feels like the Adderall works well. Completing tasks. Managing aspects of household.  Denies SI or HI.  Denies AH or VH. Denies self harm. Denies substance use.  Previous medication trials: Vyvanse  Review of Systems:  Review of Systems  Musculoskeletal:  Negative for gait problem.  Neurological:  Negative for tremors.  Psychiatric/Behavioral:         Please refer to HPI   Medications: I have reviewed the patient's current medications.  Current Outpatient Medications  Medication Sig Dispense Refill   Abaloparatide (TYMLOS) 3120 MCG/1.56ML SOPN Inject into the skin.     albuterol (VENTOLIN HFA) 108 (90 Base) MCG/ACT inhaler Inhale into the lungs.     amphetamine -dextroamphetamine  (ADDERALL) 15 MG tablet Take 1 tablet by mouth 3 (three) times daily. 90 tablet 0   [START ON 04/05/2024] amphetamine -dextroamphetamine  (ADDERALL) 15 MG tablet Take 1 tablet by mouth 3 (three) times daily. 90 tablet 0   [START ON 05/03/2024] amphetamine -dextroamphetamine  (ADDERALL) 15 MG tablet Take 1 tablet by mouth 3 (three) times daily. 90 tablet 0   Biotin 1000 MCG tablet Take by mouth.     buprenorphine-naloxone (SUBOXONE) 8-2 mg SUBL SL tablet Place under the tongue.     celecoxib  (CELEBREX ) 200 MG capsule Take 1 capsule (200 mg total) by mouth 2 (two) times daily. 30 capsule 0   Cholecalciferol (D 1000) 25 MCG (1000 UT) capsule Take by mouth.     Cyanocobalamin 2500 MCG SUBL Place under the tongue.     Evolocumab (REPATHA SURECLICK) 140  MG/ML SOAJ Inject into the skin.     ferrous sulfate 325 (65 FE) MG tablet Take by mouth.     fluticasone (FLONASE) 50 MCG/ACT nasal spray SPRAY 2 SPRAY(S) EVERY 12 HOURS BY INTRANASAL ROUTE FOR 30 DAYS.     folic acid (FOLVITE) 800 MCG tablet Take by mouth.     gabapentin (NEURONTIN) 400 MG capsule Take by mouth.      HYDROcodone -acetaminophen  (NORCO/VICODIN) 5-325 MG per tablet Take 1 tablet by mouth every 6 (six) hours as needed for moderate pain. 15 tablet 0   insulin glargine (LANTUS SOLOSTAR) 100 UNIT/ML Solostar Pen      losartan (COZAAR) 100 MG tablet TAKE 1 TABLET BY MOUTH EVERY DAY FOR BLOOD PRESSURE     magnesium oxide (MAG-OX) 400 MG tablet Take by mouth.     meclizine (ANTIVERT) 25 MG tablet TAKE 1 TABLET (25 MG) BY MOUTH ONCE DAILY AS NEEDED     montelukast (SINGULAIR) 10 MG tablet TAKE 1 TABLET BY MOUTH EVERY DAY IN THE EVENING FOR ALLERGIES     Multiple Vitamin (MULTI-VITAMIN) tablet      pantoprazole (PROTONIX) 40 MG tablet Take by mouth.     Semaglutide,0.25 or 0.5MG /DOS, (OZEMPIC, 0.25 OR 0.5 MG/DOSE,) 2 MG/3ML SOPN      No current facility-administered medications for this visit.    Medication Side Effects: None  Allergies:  Allergies  Allergen Reactions   Amitriptyline Other (See Comments)    confusion   Duloxetine Other (See Comments)    drowsiness  Other reaction(s): Lethargy (intolerance)  drowsiness   Cyclobenzaprine Other (See Comments)    Muscle jerks  Muscle jerks  Muscle jerks   Fluconazole Other (See Comments)    Muscle jerks  Muscle jerks  Muscle jerks   Linagliptin Other (See Comments)    Joint pain/muscle pain  Joint pain/muscle pain  Joint pain/muscle pain   Simvastatin Other (See Comments)    Muscle pain  Muscle pain  Muscle pain   Trazodone And Nefazodone Other (See Comments)    States she has many other allergies and does not know name  Muscle jerks  Muscle jerks  States she has many other allergies and does not know name  Muscle jerks   Vilazodone Other (See Comments)    Muscle jerks  Muscle jerks  Muscle jerks    Past Medical History:  Diagnosis Date   COPD (chronic obstructive pulmonary disease) (HCC)    Diabetes mellitus without complication (HCC)    Hypertension    Renal disorder    Thyroid disease     No family history  on file.  Social History   Socioeconomic History   Marital status: Widowed    Spouse name: Not on file   Number of children: Not on file   Years of education: Not on file   Highest education level: Not on file  Occupational History   Not on file  Tobacco Use   Smoking status: Every Day    Types: Cigarettes   Smokeless tobacco: Never  Vaping Use   Vaping status: Never Used  Substance and Sexual Activity   Alcohol use: No   Drug use: No   Sexual activity: Not on file  Other Topics Concern   Not on file  Social History Narrative   Not on file   Social Drivers of Health   Financial Resource Strain: Not on file  Food Insecurity: Low Risk  (04/24/2023)   Received from [facility]   Hunger Vital Sign  Worried About Programme researcher, broadcasting/film/video in the Last Year: Never true    Ran Out of Food in the Last Year: Never true  Transportation Needs: Not on file (04/17/2023)  Physical Activity: Not on file  Stress: Not on file  Social Connections: Not on file  Intimate Partner Violence: Not on file    Past Medical History, Surgical history, Social history, and Family history were reviewed and updated as appropriate.   Please see review of systems for further details on the patient's review from today.   Objective:   Physical Exam:  There were no vitals taken for this visit.  Physical Exam Constitutional:      General: She is not in acute distress. Musculoskeletal:        General: No deformity.  Neurological:     Mental Status: She is alert and oriented to person, place, and time.     Coordination: Coordination normal.  Psychiatric:        Attention and Perception: Attention and perception normal. She does not perceive auditory or visual hallucinations.        Mood and Affect: Mood normal. Mood is not anxious or depressed. Affect is not labile, blunt, angry or inappropriate.        Speech: Speech normal.        Behavior: Behavior normal.        Thought Content: Thought content  normal. Thought content is not paranoid or delusional. Thought content does not include homicidal or suicidal ideation. Thought content does not include homicidal or suicidal plan.        Cognition and Memory: Cognition and memory normal.        Judgment: Judgment normal.     Comments: Insight intact     Lab Review:     Component Value Date/Time   NA 134 (L) 10/18/2018 1224   K 3.6 10/18/2018 1224   CL 99 10/18/2018 1224   CO2 26 10/18/2018 1224   GLUCOSE 213 (H) 10/18/2018 1224   BUN 8 10/18/2018 1224   CREATININE 0.74 10/18/2018 1224   CALCIUM 8.9 10/18/2018 1224   PROT 6.8 10/18/2018 1224   ALBUMIN 3.6 10/18/2018 1224   AST 16 10/18/2018 1224   ALT 21 10/18/2018 1224   ALKPHOS 122 10/18/2018 1224   BILITOT 0.3 10/18/2018 1224   GFRNONAA >60 10/18/2018 1224   GFRAA >60 10/18/2018 1224       Component Value Date/Time   WBC 9.9 10/18/2018 1224   RBC 4.92 10/18/2018 1224   HGB 14.4 10/18/2018 1224   HCT 44.8 10/18/2018 1224   PLT 198 10/18/2018 1224   MCV 91.1 10/18/2018 1224   MCH 29.3 10/18/2018 1224   MCHC 32.1 10/18/2018 1224   RDW 13.3 10/18/2018 1224   LYMPHSABS 2.5 10/18/2018 1224   MONOABS 0.7 10/18/2018 1224   EOSABS 0.1 10/18/2018 1224   BASOSABS 0.0 10/18/2018 1224    No results found for: "POCLITH", "LITHIUM"   No results found for: "PHENYTOIN", "PHENOBARB", "VALPROATE", "CBMZ"   .res Assessment: Plan:    Plan:  PDMP reviewed  Adderall 15mg  TID   Monitor BP between visits while taking stimulant medication - 130/80  Therapist - Elisa Stoneham  RTC 3 months  20 minutes spent dedicated to the care of this patient on the date of this encounter to include pre-visit review of records, ordering of medication, post visit documentation, and face-to-face time with the patient discussing ADD. Discussed continuing current treatment regimen. Will call for yearly physical and  labs with Dr. Marne Sings.   Discussed potential benefits, risks, and side effects of  stimulants with patient to include increased heart rate, palpitations, insomnia, increased anxiety, increased irritability, or decreased appetite. Instructed patient to contact office if experiencing any significant tolerability issues.   Patient advised to contact office with any questions, adverse effects, or acute worsening in signs and symptoms.  Diagnoses and all orders for this visit:  Attention deficit hyperactivity disorder (ADHD), combined type -     amphetamine -dextroamphetamine  (ADDERALL) 15 MG tablet; Take 1 tablet by mouth 3 (three) times daily. -     amphetamine -dextroamphetamine  (ADDERALL) 15 MG tablet; Take 1 tablet by mouth 3 (three) times daily. -     amphetamine -dextroamphetamine  (ADDERALL) 15 MG tablet; Take 1 tablet by mouth 3 (three) times daily.     Please see After Visit Summary for patient specific instructions.  No future appointments.  No orders of the defined types were placed in this encounter.     -------------------------------

## 2024-03-26 ENCOUNTER — Telehealth: Payer: Self-pay | Admitting: Adult Health

## 2024-03-26 ENCOUNTER — Other Ambulatory Visit: Payer: Self-pay

## 2024-03-26 DIAGNOSIS — F902 Attention-deficit hyperactivity disorder, combined type: Secondary | ICD-10-CM

## 2024-03-26 MED ORDER — AMPHETAMINE-DEXTROAMPHETAMINE 15 MG PO TABS: 15.0000 mg | ORAL_TABLET | Freq: Three times a day (TID) | ORAL | 0 refills | Status: DC

## 2024-03-26 NOTE — Telephone Encounter (Signed)
 Called pharmacy and they said they don't have the Rx dated 4/24, but have the one for May and June. Repended Rx for fill today.

## 2024-03-26 NOTE — Telephone Encounter (Signed)
 LF 4/12, has RF with fill date of 4/24. Will call pharmacy.

## 2024-03-26 NOTE — Telephone Encounter (Signed)
 Pt LVM 5/11 10:40 am stating Rx has fill date 5/22.  She is out and last RF is dated  4/24. Pt stated she filled 4/12. RTC [phone]

## 2024-06-20 ENCOUNTER — Telehealth: Payer: Self-pay | Admitting: Adult Health

## 2024-06-20 ENCOUNTER — Other Ambulatory Visit: Payer: Self-pay

## 2024-06-20 DIAGNOSIS — F902 Attention-deficit hyperactivity disorder, combined type: Secondary | ICD-10-CM

## 2024-06-20 MED ORDER — AMPHETAMINE-DEXTROAMPHETAMINE 15 MG PO TABS: 15.0000 mg | ORAL_TABLET | Freq: Three times a day (TID) | ORAL | 0 refills | Status: DC

## 2024-06-20 NOTE — Telephone Encounter (Addendum)
 Pt requesting Rx Adderall 15 mg 3/d [facility]. Apt 8/21

## 2024-06-20 NOTE — Telephone Encounter (Signed)
 Pended.

## 2024-07-05 ENCOUNTER — Ambulatory Visit (INDEPENDENT_AMBULATORY_CARE_PROVIDER_SITE_OTHER): Payer: Self-pay | Admitting: Adult Health

## 2024-07-05 DIAGNOSIS — Z91199 Patient's noncompliance with other medical treatment and regimen due to unspecified reason: Secondary | ICD-10-CM

## 2024-07-05 NOTE — Progress Notes (Deleted)
 Bridget Johnson [id number] 04-27-1956 68 y.o.  Virtual Visit via Telephone Note  I connected with pt on 07/05/24 at  2:30 PM EDT by telephone and verified that I am speaking with the correct person using two identifiers.   I discussed the limitations, risks, security and privacy concerns of performing an evaluation and management service by telephone and the availability of in person appointments. I also discussed with the patient that there may be a patient responsible charge related to this service. The patient expressed understanding and agreed to proceed.   I discussed the assessment and treatment plan with the patient. The patient was provided an opportunity to ask questions and all were answered. The patient agreed with the plan and demonstrated an understanding of the instructions.   The patient was advised to call back or seek an in-person evaluation if the symptoms worsen or if the condition fails to improve as anticipated.  I provided *** minutes of non-face-to-face time during this encounter.  The patient was located at home.  The provider was located at [facility].   Angeline LOISE Sayers, NP   Subjective:   Patient ID:  Bridget Johnson is a 68 y.o. (DOB 02-10-1956) female.  Chief Complaint: No chief complaint on file.   HPI Bridget Johnson presents for follow-up of ***   Review of Systems:  Review of Systems  Medications: {medication reviewed/display:3041432}  Current Outpatient Medications  Medication Sig Dispense Refill   Abaloparatide (TYMLOS) 3120 MCG/1.56ML SOPN Inject into the skin.     albuterol (VENTOLIN HFA) 108 (90 Base) MCG/ACT inhaler Inhale into the lungs.     amphetamine -dextroamphetamine  (ADDERALL) 15 MG tablet Take 1 tablet by mouth 3 (three) times daily. 90 tablet 0   amphetamine -dextroamphetamine  (ADDERALL) 15 MG tablet Take 1 tablet by mouth 3 (three) times daily. 90 tablet 0   amphetamine -dextroamphetamine  (ADDERALL) 15 MG tablet Take 1 tablet  by mouth 3 (three) times daily. 90 tablet 0   Biotin 1000 MCG tablet Take by mouth.     buprenorphine-naloxone (SUBOXONE) 8-2 mg SUBL SL tablet Place under the tongue.     celecoxib  (CELEBREX ) 200 MG capsule Take 1 capsule (200 mg total) by mouth 2 (two) times daily. 30 capsule 0   Cholecalciferol (D 1000) 25 MCG (1000 UT) capsule Take by mouth.     Cyanocobalamin 2500 MCG SUBL Place under the tongue.     Evolocumab (REPATHA SURECLICK) 140 MG/ML SOAJ Inject into the skin.     ferrous sulfate 325 (65 FE) MG tablet Take by mouth.     fluticasone (FLONASE) 50 MCG/ACT nasal spray SPRAY 2 SPRAY(S) EVERY 12 HOURS BY INTRANASAL ROUTE FOR 30 DAYS.     folic acid (FOLVITE) 800 MCG tablet Take by mouth.     gabapentin (NEURONTIN) 400 MG capsule Take by mouth.     HYDROcodone -acetaminophen  (NORCO/VICODIN) 5-325 MG per tablet Take 1 tablet by mouth every 6 (six) hours as needed for moderate pain. 15 tablet 0   insulin glargine (LANTUS SOLOSTAR) 100 UNIT/ML Solostar Pen      losartan (COZAAR) 100 MG tablet TAKE 1 TABLET BY MOUTH EVERY DAY FOR BLOOD PRESSURE     magnesium oxide (MAG-OX) 400 MG tablet Take by mouth.     meclizine (ANTIVERT) 25 MG tablet TAKE 1 TABLET (25 MG) BY MOUTH ONCE DAILY AS NEEDED     montelukast (SINGULAIR) 10 MG tablet TAKE 1 TABLET BY MOUTH EVERY DAY IN THE EVENING FOR ALLERGIES     Multiple Vitamin (MULTI-VITAMIN) tablet  pantoprazole (PROTONIX) 40 MG tablet Take by mouth.     Semaglutide,0.25 or 0.5MG /DOS, (OZEMPIC, 0.25 OR 0.5 MG/DOSE,) 2 MG/3ML SOPN      No current facility-administered medications for this visit.    Medication Side Effects: {Medication Side Effects (Optional):21014029}  Allergies:  Allergies  Allergen Reactions   Amitriptyline Other (See Comments)    confusion   Duloxetine Other (See Comments)    drowsiness  Other reaction(s): Lethargy (intolerance)  drowsiness   Cyclobenzaprine Other (See Comments)    Muscle jerks  Muscle jerks  Muscle  jerks   Fluconazole Other (See Comments)    Muscle jerks  Muscle jerks  Muscle jerks   Linagliptin Other (See Comments)    Joint pain/muscle pain  Joint pain/muscle pain  Joint pain/muscle pain   Simvastatin Other (See Comments)    Muscle pain  Muscle pain  Muscle pain   Trazodone And Nefazodone Other (See Comments)    States she has many other allergies and does not know name  Muscle jerks  Muscle jerks  States she has many other allergies and does not know name  Muscle jerks   Vilazodone Other (See Comments)    Muscle jerks  Muscle jerks  Muscle jerks    Past Medical History:  Diagnosis Date   COPD (chronic obstructive pulmonary disease) (HCC)    Diabetes mellitus without complication (HCC)    Hypertension    Renal disorder    Thyroid disease     No family history on file.  Social History   Socioeconomic History   Marital status: Widowed    Spouse name: Not on file   Number of children: Not on file   Years of education: Not on file   Highest education level: Not on file  Occupational History   Not on file  Tobacco Use   Smoking status: Every Day    Types: Cigarettes   Smokeless tobacco: Never  Vaping Use   Vaping status: Never Used  Substance and Sexual Activity   Alcohol use: No   Drug use: No   Sexual activity: Not on file  Other Topics Concern   Not on file  Social History Narrative   Not on file   Social Drivers of Health   Financial Resource Strain: Not on file  Food Insecurity: Low Risk  (04/24/2023)   Received from [facility]   Hunger Vital Sign    Within the past 12 months, you worried that your food would run out before you got money to buy more: Not on file    Within the past 12 months, the food you bought just didn't last and you didn't have money to get more. : Never true  Transportation Needs: Not on file (04/17/2023)  Physical Activity: Not on file  Stress: Not on file  Social Connections: Not on file  Intimate Partner  Violence: Not on file    Past Medical History, Surgical history, Social history, and Family history were reviewed and updated as appropriate.   Please see review of systems for further details on the patient's review from today.   Objective:   Physical Exam:  There were no vitals taken for this visit.  Physical Exam  Lab Review:     Component Value Date/Time   NA 134 (L) 10/18/2018 1224   K 3.6 10/18/2018 1224   CL 99 10/18/2018 1224   CO2 26 10/18/2018 1224   GLUCOSE 213 (H) 10/18/2018 1224   BUN 8 10/18/2018 1224   CREATININE  0.74 10/18/2018 1224   CALCIUM 8.9 10/18/2018 1224   PROT 6.8 10/18/2018 1224   ALBUMIN 3.6 10/18/2018 1224   AST 16 10/18/2018 1224   ALT 21 10/18/2018 1224   ALKPHOS 122 10/18/2018 1224   BILITOT 0.3 10/18/2018 1224   GFRNONAA >60 10/18/2018 1224   GFRAA >60 10/18/2018 1224       Component Value Date/Time   WBC 9.9 10/18/2018 1224   RBC 4.92 10/18/2018 1224   HGB 14.4 10/18/2018 1224   HCT 44.8 10/18/2018 1224   PLT 198 10/18/2018 1224   MCV 91.1 10/18/2018 1224   MCH 29.3 10/18/2018 1224   MCHC 32.1 10/18/2018 1224   RDW 13.3 10/18/2018 1224   LYMPHSABS 2.5 10/18/2018 1224   MONOABS 0.7 10/18/2018 1224   EOSABS 0.1 10/18/2018 1224   BASOSABS 0.0 10/18/2018 1224    No results found for: POCLITH, LITHIUM   No results found for: PHENYTOIN, PHENOBARB, VALPROATE, CBMZ   .res Assessment: Plan:    There are no diagnoses linked to this encounter.  Please see After Visit Summary for patient specific instructions.  Future Appointments  Date Time Provider Department Center  07/05/2024  2:30 PM Darnise Montag Nattalie, NP [facility] None    No orders of the defined types were placed in this encounter.     -------------------------------

## 2024-07-05 NOTE — Progress Notes (Signed)
 Patient no show appointment. ? ?

## 2024-07-20 ENCOUNTER — Telehealth: Payer: Self-pay | Admitting: Adult Health

## 2024-07-20 ENCOUNTER — Other Ambulatory Visit: Payer: Self-pay

## 2024-07-20 DIAGNOSIS — F902 Attention-deficit hyperactivity disorder, combined type: Secondary | ICD-10-CM

## 2024-07-20 MED ORDER — AMPHETAMINE-DEXTROAMPHETAMINE 15 MG PO TABS: 15.0000 mg | ORAL_TABLET | Freq: Three times a day (TID) | ORAL | 0 refills | Status: DC

## 2024-07-20 NOTE — Telephone Encounter (Signed)
 Next visit is 08/10/24. Bridget Johnson is requesting a refill on her generic Adderall 15 mg called to:  [facility], [address], [place], [place]. Phone number is [phone].

## 2024-07-20 NOTE — Telephone Encounter (Signed)
 Has been done!

## 2024-07-20 NOTE — Telephone Encounter (Signed)
 Next visit is 08/10/24. Ena is requesting a refill on her generic Adderall 15 mg called to:  [facility], [address], [place], [place]. Phone number is [phone].

## 2024-08-03 ENCOUNTER — Telehealth: Admitting: Adult Health

## 2024-08-10 ENCOUNTER — Encounter: Payer: Self-pay | Admitting: Adult Health

## 2024-08-10 ENCOUNTER — Telehealth (INDEPENDENT_AMBULATORY_CARE_PROVIDER_SITE_OTHER): Admitting: Adult Health

## 2024-08-10 ENCOUNTER — Other Ambulatory Visit: Payer: Self-pay

## 2024-08-10 ENCOUNTER — Telehealth: Payer: Self-pay | Admitting: Adult Health

## 2024-08-10 DIAGNOSIS — F902 Attention-deficit hyperactivity disorder, combined type: Secondary | ICD-10-CM

## 2024-08-10 DIAGNOSIS — F909 Attention-deficit hyperactivity disorder, unspecified type: Secondary | ICD-10-CM | POA: Diagnosis not present

## 2024-08-10 MED ORDER — AMPHETAMINE-DEXTROAMPHETAMINE 15 MG PO TABS: 15.0000 mg | ORAL_TABLET | Freq: Every day | ORAL | 0 refills | Status: DC

## 2024-08-10 MED ORDER — AMPHETAMINE-DEXTROAMPHETAMINE 15 MG PO TABS
15.0000 mg | ORAL_TABLET | Freq: Three times a day (TID) | ORAL | 0 refills | Status: DC
Start: 2024-08-10 — End: 2024-08-10

## 2024-08-10 MED ORDER — AMPHETAMINE-DEXTROAMPHETAMINE 15 MG PO TABS
15.0000 mg | ORAL_TABLET | Freq: Three times a day (TID) | ORAL | 0 refills | Status: DC
Start: 2024-10-05 — End: 2024-08-10

## 2024-08-10 MED ORDER — AMPHETAMINE-DEXTROAMPHETAMINE 15 MG PO TABS: 15.0000 mg | ORAL_TABLET | Freq: Three times a day (TID) | ORAL | 0 refills | Status: DC

## 2024-08-10 NOTE — Addendum Note (Signed)
 Addended by: LAWERANCE ANGELINE SAILOR on: 08/10/2024 04:56 PM   Modules accepted: Orders

## 2024-08-10 NOTE — Telephone Encounter (Signed)
 Three scripts were sent today for Adderall. Sometimes when we send 3 scripts only 2 will go thru. Pharmacist also reported computer outage today. Rx repended for Rx due today.

## 2024-08-10 NOTE — Telephone Encounter (Signed)
 Patient called in for refill on Adderall 15mg . Prescription was last filled 9/5 for 21 days and she is on her last day. She spoke with pharmacy and was told they didn't have prescription sent for today and needs refill sent again to pharmacy. PH: [phone] Appt 11/19 [facility] [address] [place]

## 2024-08-10 NOTE — Progress Notes (Signed)
 Bridget Johnson [id number] 05/17/1956 68 y.o.  Virtual Visit via Video Note  I connected with pt @ on 08/10/24 at  1:00 PM EDT by a video enabled telemedicine application and verified that I am speaking with the correct person using two identifiers.   I discussed the limitations of evaluation and management by telemedicine and the availability of in person appointments. The patient expressed understanding and agreed to proceed.  I discussed the assessment and treatment plan with the patient. The patient was provided an opportunity to ask questions and all were answered. The patient agreed with the plan and demonstrated an understanding of the instructions.   The patient was advised to call back or seek an in-person evaluation if the symptoms worsen or if the condition fails to improve as anticipated.  I provided 15 minutes of non-face-to-face time during this encounter.  The patient was located at home.  The provider was located at [facility].   Bridget LOISE Sayers, NP   Subjective:   Patient ID:  Bridget Johnson is a 68 y.o. (DOB Dec 08, 1955) female.  Chief Complaint: No chief complaint on file.   HPI Bridget Johnson presents for follow-up of ADD.  Describes mood today as ok. Pleasant. Denies tearfulness. Mood symptoms - denies depression, anxiety and irritability. Reports improved interest and motivation. Denies panic attacks. Reports some worry. Denies rumination and over thinking. Reports mood is pretty stable. Stating I feel like I'm doing good - has things to look forward to. Feels like the Adderall continues to work well. Taking medications as prescribed. Followed by [facility] - Bridget Johnson.  Energy levels vary. Active, does not have a regular exercise routine - difficulties walking - 2 failed back surgeries.  Enjoys some usual interests and activities. Widowed. Lives alone. Has 2 sons. Spending time with family. Appetite adequate. Weight gain - started Ozempic 6  weeks ago. Sleeps is variable. Averages 5 hours. Reports daytime napping - 1 hour twice daily.  Focus and concentration stable. Diagnosed with ADD in her thirties. Reports being trialed on various medications - feels like the Adderall works well. Completing tasks. Managing aspects of household.  Denies SI or HI.  Denies AH or VH. Denies self harm. Denies substance use.  Previous medication trials: Vyvanse  Review of Systems:  Review of Systems  Musculoskeletal:  Negative for gait problem.  Neurological:  Negative for tremors.  Psychiatric/Behavioral:         Please refer to HPI    Medications: I have reviewed the patient's current medications.  Current Outpatient Medications  Medication Sig Dispense Refill   Abaloparatide (TYMLOS) 3120 MCG/1.56ML SOPN Inject into the skin.     albuterol (VENTOLIN HFA) 108 (90 Base) MCG/ACT inhaler Inhale into the lungs.     amphetamine -dextroamphetamine  (ADDERALL) 15 MG tablet Take 1 tablet by mouth 3 (three) times daily. 90 tablet 0   amphetamine -dextroamphetamine  (ADDERALL) 15 MG tablet Take 1 tablet by mouth 3 (three) times daily. 90 tablet 0   amphetamine -dextroamphetamine  (ADDERALL) 15 MG tablet Take 1 tablet by mouth 3 (three) times daily. 63 tablet 0   Biotin 1000 MCG tablet Take by mouth.     buprenorphine-naloxone (SUBOXONE) 8-2 mg SUBL SL tablet Place under the tongue.     celecoxib  (CELEBREX ) 200 MG capsule Take 1 capsule (200 mg total) by mouth 2 (two) times daily. 30 capsule 0   Cholecalciferol (D 1000) 25 MCG (1000 UT) capsule Take by mouth.     Cyanocobalamin 2500 MCG SUBL Place under the  tongue.     Evolocumab (REPATHA SURECLICK) 140 MG/ML SOAJ Inject into the skin.     ferrous sulfate 325 (65 FE) MG tablet Take by mouth.     fluticasone (FLONASE) 50 MCG/ACT nasal spray SPRAY 2 SPRAY(S) EVERY 12 HOURS BY INTRANASAL ROUTE FOR 30 DAYS.     folic acid (FOLVITE) 800 MCG tablet Take by mouth.     gabapentin (NEURONTIN) 400 MG capsule  Take by mouth.     HYDROcodone -acetaminophen  (NORCO/VICODIN) 5-325 MG per tablet Take 1 tablet by mouth every 6 (six) hours as needed for moderate pain. 15 tablet 0   insulin glargine (LANTUS SOLOSTAR) 100 UNIT/ML Solostar Pen      losartan (COZAAR) 100 MG tablet TAKE 1 TABLET BY MOUTH EVERY DAY FOR BLOOD PRESSURE     magnesium oxide (MAG-OX) 400 MG tablet Take by mouth.     meclizine (ANTIVERT) 25 MG tablet TAKE 1 TABLET (25 MG) BY MOUTH ONCE DAILY AS NEEDED     montelukast (SINGULAIR) 10 MG tablet TAKE 1 TABLET BY MOUTH EVERY DAY IN THE EVENING FOR ALLERGIES     Multiple Vitamin (MULTI-VITAMIN) tablet      pantoprazole (PROTONIX) 40 MG tablet Take by mouth.     Semaglutide,0.25 or 0.5MG /DOS, (OZEMPIC, 0.25 OR 0.5 MG/DOSE,) 2 MG/3ML SOPN      No current facility-administered medications for this visit.    Medication Side Effects: None  Allergies:  Allergies  Allergen Reactions   Amitriptyline Other (See Comments)    confusion   Duloxetine Other (See Comments)    drowsiness  Other reaction(s): Lethargy (intolerance)  drowsiness   Cyclobenzaprine Other (See Comments)    Muscle jerks  Muscle jerks  Muscle jerks   Fluconazole Other (See Comments)    Muscle jerks  Muscle jerks  Muscle jerks   Linagliptin Other (See Comments)    Joint pain/muscle pain  Joint pain/muscle pain  Joint pain/muscle pain   Simvastatin Other (See Comments)    Muscle pain  Muscle pain  Muscle pain   Trazodone And Nefazodone Other (See Comments)    States she has many other allergies and does not know name  Muscle jerks  Muscle jerks  States she has many other allergies and does not know name  Muscle jerks   Vilazodone Other (See Comments)    Muscle jerks  Muscle jerks  Muscle jerks    Past Medical History:  Diagnosis Date   COPD (chronic obstructive pulmonary disease) (HCC)    Diabetes mellitus without complication (HCC)    Hypertension    Renal disorder    Thyroid disease      No family history on file.  Social History   Socioeconomic History   Marital status: Widowed    Spouse name: Not on file   Number of children: Not on file   Years of education: Not on file   Highest education level: Not on file  Occupational History   Not on file  Tobacco Use   Smoking status: Every Day    Types: Cigarettes   Smokeless tobacco: Never  Vaping Use   Vaping status: Never Used  Substance and Sexual Activity   Alcohol use: No   Drug use: No   Sexual activity: Not on file  Other Topics Concern   Not on file  Social History Narrative   Not on file   Social Drivers of Health   Financial Resource Strain: Not on file  Food Insecurity: Low Risk  (04/24/2023)  Received from [facility]   Hunger Vital Sign    Within the past 12 months, you worried that your food would run out before you got money to buy more: Not on file    Within the past 12 months, the food you bought just didn't last and you didn't have money to get more. : Never true  Transportation Needs: Not on file (04/17/2023)  Physical Activity: Not on file  Stress: Not on file  Social Connections: Not on file  Intimate Partner Violence: Not on file    Past Medical History, Surgical history, Social history, and Family history were reviewed and updated as appropriate.   Please see review of systems for further details on the patient's review from today.   Objective:   Physical Exam:  There were no vitals taken for this visit.  Physical Exam Constitutional:      General: She is not in acute distress. Musculoskeletal:        General: No deformity.  Neurological:     Mental Status: She is alert and oriented to person, place, and time.     Coordination: Coordination normal.  Psychiatric:        Attention and Perception: Attention and perception normal. She does not perceive auditory or visual hallucinations.        Mood and Affect: Mood normal. Mood is not anxious or depressed. Affect is not  labile, blunt, angry or inappropriate.        Speech: Speech normal.        Behavior: Behavior normal.        Thought Content: Thought content normal. Thought content is not paranoid or delusional. Thought content does not include homicidal or suicidal ideation. Thought content does not include homicidal or suicidal plan.        Cognition and Memory: Cognition and memory normal.        Judgment: Judgment normal.     Comments: Insight intact     Lab Review:     Component Value Date/Time   NA 134 (L) 10/18/2018 1224   K 3.6 10/18/2018 1224   CL 99 10/18/2018 1224   CO2 26 10/18/2018 1224   GLUCOSE 213 (H) 10/18/2018 1224   BUN 8 10/18/2018 1224   CREATININE 0.74 10/18/2018 1224   CALCIUM 8.9 10/18/2018 1224   PROT 6.8 10/18/2018 1224   ALBUMIN 3.6 10/18/2018 1224   AST 16 10/18/2018 1224   ALT 21 10/18/2018 1224   ALKPHOS 122 10/18/2018 1224   BILITOT 0.3 10/18/2018 1224   GFRNONAA >60 10/18/2018 1224   GFRAA >60 10/18/2018 1224       Component Value Date/Time   WBC 9.9 10/18/2018 1224   RBC 4.92 10/18/2018 1224   HGB 14.4 10/18/2018 1224   HCT 44.8 10/18/2018 1224   PLT 198 10/18/2018 1224   MCV 91.1 10/18/2018 1224   MCH 29.3 10/18/2018 1224   MCHC 32.1 10/18/2018 1224   RDW 13.3 10/18/2018 1224   LYMPHSABS 2.5 10/18/2018 1224   MONOABS 0.7 10/18/2018 1224   EOSABS 0.1 10/18/2018 1224   BASOSABS 0.0 10/18/2018 1224    No results found for: POCLITH, LITHIUM   No results found for: PHENYTOIN, PHENOBARB, VALPROATE, CBMZ   .res Assessment: Plan:    Plan:  PDMP reviewed  Adderall 15mg  TID   Monitor BP between visits while taking stimulant medication - 125/80  Therapist - Elisa Stoneham  RTC 3 months  15 minutes spent dedicated to the care of this patient on  the date of this encounter to include pre-visit review of records, ordering of medication, post visit documentation, and face-to-face time with the patient discussing ADD. Discussed  continuing current treatment regimen. Will call for yearly physical and labs with Dr. Zena.   Discussed potential benefits, risks, and side effects of stimulants with patient to include increased heart rate, palpitations, insomnia, increased anxiety, increased irritability, or decreased appetite. Instructed patient to contact office if experiencing any significant tolerability issues.   Patient advised to contact office with any questions, adverse effects, or acute worsening in signs and symptoms.   There are no diagnoses linked to this encounter.   Please see After Visit Summary for patient specific instructions.  Future Appointments  Date Time Provider Department Center  08/10/2024  1:00 PM Aikam Vinje Nattalie, NP [facility] None  10/03/2024  2:00 PM Hollis Oh Nattalie, NP [facility] None    No orders of the defined types were placed in this encounter.     -------------------------------

## 2024-10-03 ENCOUNTER — Ambulatory Visit: Admitting: Adult Health

## 2024-10-30 ENCOUNTER — Ambulatory Visit: Admitting: Adult Health

## 2024-10-30 ENCOUNTER — Encounter: Payer: Self-pay | Admitting: Adult Health

## 2024-10-30 DIAGNOSIS — F909 Attention-deficit hyperactivity disorder, unspecified type: Secondary | ICD-10-CM | POA: Diagnosis not present

## 2024-10-30 DIAGNOSIS — F902 Attention-deficit hyperactivity disorder, combined type: Secondary | ICD-10-CM

## 2024-10-30 MED ORDER — AMPHETAMINE-DEXTROAMPHETAMINE 15 MG PO TABS: 15.0000 mg | ORAL_TABLET | Freq: Every day | ORAL | 0 refills | Status: DC

## 2024-10-30 NOTE — Progress Notes (Signed)
 [name] [id number] June 22, 1956 68 y.o.  Subjective:   Patient ID:  Bridget Johnson is a 68 y.o. (DOB 09/17/1956) female.  Chief Complaint: No chief complaint on file.   HPI Ashelynn Johnson presents to the office today for follow-up of ADD.  Describes mood today as ok. Pleasant. Denies tearfulness. Mood symptoms - denies depression, anxiety and irritability. Reports improved interest and motivation. Denies panic attacks. Reports some worry. Denies rumination and over thinking. Reports mood is stable. Stating I feel like I'm doing ok. Feels like the Adderall continues to work well. Taking medications as prescribed. Followed by [facility] - [name].  Energy levels vary. Active, does not have a regular exercise routine - difficulties walking - 2 failed back surgeries.  Enjoys some usual interests and activities. Widowed. Lives alone. Has 2 sons. Spending time with family. Appetite adequate. Weight gain - has tried GLP-1 - would like to trial Zepbound. Reports sleeps is variable. Averages 5 to 6 hours. Reports daytime napping. Reports focus and concentration stable with medication. Diagnosed with ADD in her thirties. Reports being trialed on various medications - feels like the Adderall works well. Completing tasks. Managing aspects of household.  Denies SI or HI.  Denies AH or VH. Denies self harm. Denies substance use.  Previous medication trials: Vyvanse   PHQ2-9    Flowsheet Row Video Visit from 05/30/2023 in [facility]  PHQ-2 Total Score 0     Review of Systems:  Review of Systems  Musculoskeletal:  Negative for gait problem.  Neurological:  Negative for tremors.  Psychiatric/Behavioral:         Please refer to HPI    Medications: I have reviewed the patient's current medications.  Current Outpatient Medications  Medication Sig Dispense Refill   Abaloparatide (TYMLOS) 3120 MCG/1.56ML SOPN Inject into the skin.     albuterol  (VENTOLIN HFA) 108 (90 Base) MCG/ACT inhaler Inhale into the lungs.     amphetamine -dextroamphetamine  (ADDERALL) 15 MG tablet Take 1 tablet by mouth daily. 30 tablet 0   amphetamine -dextroamphetamine  (ADDERALL) 15 MG tablet Take 1 tablet by mouth daily. 30 tablet 0   amphetamine -dextroamphetamine  (ADDERALL) 15 MG tablet Take 1 tablet by mouth daily. 30 tablet 0   Biotin 1000 MCG tablet Take by mouth.     buprenorphine-naloxone (SUBOXONE) 8-2 mg SUBL SL tablet Place under the tongue.     celecoxib  (CELEBREX ) 200 MG capsule Take 1 capsule (200 mg total) by mouth 2 (two) times daily. 30 capsule 0   Cholecalciferol (D 1000) 25 MCG (1000 UT) capsule Take by mouth.     Cyanocobalamin 2500 MCG SUBL Place under the tongue.     Evolocumab (REPATHA SURECLICK) 140 MG/ML SOAJ Inject into the skin.     ferrous sulfate 325 (65 FE) MG tablet Take by mouth.     fluticasone (FLONASE) 50 MCG/ACT nasal spray SPRAY 2 SPRAY(S) EVERY 12 HOURS BY INTRANASAL ROUTE FOR 30 DAYS.     folic acid (FOLVITE) 800 MCG tablet Take by mouth.     gabapentin (NEURONTIN) 400 MG capsule Take by mouth.     HYDROcodone -acetaminophen  (NORCO/VICODIN) 5-325 MG per tablet Take 1 tablet by mouth every 6 (six) hours as needed for moderate pain. 15 tablet 0   insulin glargine (LANTUS SOLOSTAR) 100 UNIT/ML Solostar Pen      losartan (COZAAR) 100 MG tablet TAKE 1 TABLET BY MOUTH EVERY DAY FOR BLOOD PRESSURE     magnesium oxide (MAG-OX) 400 MG tablet Take by  mouth.     meclizine (ANTIVERT) 25 MG tablet TAKE 1 TABLET (25 MG) BY MOUTH ONCE DAILY AS NEEDED     montelukast (SINGULAIR) 10 MG tablet TAKE 1 TABLET BY MOUTH EVERY DAY IN THE EVENING FOR ALLERGIES     Multiple Vitamin (MULTI-VITAMIN) tablet      pantoprazole (PROTONIX) 40 MG tablet Take by mouth.     Semaglutide,0.25 or 0.5MG /DOS, (OZEMPIC, 0.25 OR 0.5 MG/DOSE,) 2 MG/3ML SOPN      No current facility-administered medications for this visit.    Medication Side Effects:  None  Allergies: Allergies[1]  Past Medical History:  Diagnosis Date   COPD (chronic obstructive pulmonary disease) (HCC)    Diabetes mellitus without complication (HCC)    Hypertension    Renal disorder    Thyroid disease     Past Medical History, Surgical history, Social history, and Family history were reviewed and updated as appropriate.   Please see review of systems for further details on the patient's review from today.   Objective:   Physical Exam:  There were no vitals taken for this visit.  Physical Exam Constitutional:      General: She is not in acute distress. Musculoskeletal:        General: No deformity.  Neurological:     Mental Status: She is alert and oriented to person, place, and time.     Coordination: Coordination normal.  Psychiatric:        Attention and Perception: Attention and perception normal. She does not perceive auditory or visual hallucinations.        Mood and Affect: Mood normal. Mood is not anxious or depressed. Affect is not labile, blunt, angry or inappropriate.        Speech: Speech normal.        Behavior: Behavior normal.        Thought Content: Thought content normal. Thought content is not paranoid or delusional. Thought content does not include homicidal or suicidal ideation. Thought content does not include homicidal or suicidal plan.        Cognition and Memory: Cognition and memory normal.        Judgment: Judgment normal.     Comments: Insight intact     Lab Review:     Component Value Date/Time   NA 134 (L) 10/18/2018 1224   K 3.6 10/18/2018 1224   CL 99 10/18/2018 1224   CO2 26 10/18/2018 1224   GLUCOSE 213 (H) 10/18/2018 1224   BUN 8 10/18/2018 1224   CREATININE 0.74 10/18/2018 1224   CALCIUM 8.9 10/18/2018 1224   PROT 6.8 10/18/2018 1224   ALBUMIN 3.6 10/18/2018 1224   AST 16 10/18/2018 1224   ALT 21 10/18/2018 1224   ALKPHOS 122 10/18/2018 1224   BILITOT 0.3 10/18/2018 1224   GFRNONAA >60 10/18/2018 1224    GFRAA >60 10/18/2018 1224       Component Value Date/Time   WBC 9.9 10/18/2018 1224   RBC 4.92 10/18/2018 1224   HGB 14.4 10/18/2018 1224   HCT 44.8 10/18/2018 1224   PLT 198 10/18/2018 1224   MCV 91.1 10/18/2018 1224   MCH 29.3 10/18/2018 1224   MCHC 32.1 10/18/2018 1224   RDW 13.3 10/18/2018 1224   LYMPHSABS 2.5 10/18/2018 1224   MONOABS 0.7 10/18/2018 1224   EOSABS 0.1 10/18/2018 1224   BASOSABS 0.0 10/18/2018 1224    No results found for: POCLITH, LITHIUM   No results found for: PHENYTOIN, PHENOBARB, VALPROATE, CBMZ   .  res Assessment: Plan:    Plan:  PDMP reviewed  Adderall 15mg  TID   Monitor BP between visits while taking stimulant medication - 125/80  Therapist - Elisa Stoneham  RTC 3 months  15 minutes spent dedicated to the care of this patient on the date of this encounter to include pre-visit review of records, ordering of medication, post visit documentation, and face-to-face time with the patient discussing ADD. Discussed continuing current treatment regimen.   Discussed potential benefits, risks, and side effects of stimulants with patient to include increased heart rate, palpitations, insomnia, increased anxiety, increased irritability, or decreased appetite. Instructed patient to contact office if experiencing any significant tolerability issues.   Patient advised to contact office with any questions, adverse effects, or acute worsening in signs and symptoms.  There are no diagnoses linked to this encounter.   Please see After Visit Summary for patient specific instructions.  Future Appointments  Date Time Provider Department Center  10/30/2024  9:30 AM Seamus Warehime Nattalie, NP [facility] None    No orders of the defined types were placed in this encounter.   -------------------------------     [1]  Allergies Allergen Reactions   Amitriptyline Other (See Comments)    confusion   Duloxetine Other (See Comments)     drowsiness  Other reaction(s): Lethargy (intolerance)  drowsiness   Cyclobenzaprine Other (See Comments)    Muscle jerks  Muscle jerks  Muscle jerks   Fluconazole Other (See Comments)    Muscle jerks  Muscle jerks  Muscle jerks   Linagliptin Other (See Comments)    Joint pain/muscle pain  Joint pain/muscle pain  Joint pain/muscle pain   Simvastatin Other (See Comments)    Muscle pain  Muscle pain  Muscle pain   Trazodone And Nefazodone Other (See Comments)    States she has many other allergies and does not know name  Muscle jerks  Muscle jerks  States she has many other allergies and does not know name  Muscle jerks   Vilazodone Other (See Comments)    Muscle jerks  Muscle jerks  Muscle jerks

## 2024-11-05 ENCOUNTER — Telehealth: Payer: Self-pay | Admitting: Adult Health

## 2024-11-05 NOTE — Telephone Encounter (Signed)
 Pt called at 10:57a stating her Adderall script was supposed to get be for 90 pills for 1 month because she takes 3 a day.  She picked it up on Sat and only got 30 pills.  Confirmed pharmacy is   [id number] - [place], [place] - [address], [place] [address] Phone: [phone]  Fax: [phone]   Subsequent scripts for Jan and Feb also need to be fixed.  Next appt 3/16

## 2024-11-06 ENCOUNTER — Other Ambulatory Visit: Payer: Self-pay

## 2024-11-06 DIAGNOSIS — F902 Attention-deficit hyperactivity disorder, combined type: Secondary | ICD-10-CM

## 2024-11-06 MED ORDER — AMPHETAMINE-DEXTROAMPHETAMINE 15 MG PO TABS: 15.0000 mg | ORAL_TABLET | Freq: Three times a day (TID) | ORAL | 0 refills | Status: AC

## 2024-11-06 NOTE — Telephone Encounter (Signed)
 Corrected scripts and repended.

## 2024-11-12 ENCOUNTER — Telehealth: Payer: Self-pay | Admitting: Adult Health

## 2024-11-12 NOTE — Telephone Encounter (Signed)
 Pt lvm wanting to talk with nurse because her script is wrong. Did not specify issue

## 2024-11-12 NOTE — Telephone Encounter (Signed)
Have already spoken with patient

## 2025-01-28 ENCOUNTER — Ambulatory Visit: Admitting: Adult Health
